# Patient Record
Sex: Female | Born: 1940 | Race: Black or African American | Hispanic: No | Smoking: Never smoker
Health system: Southern US, Community
[De-identification: ages and names within clinical notes are randomized; demographics above are authoritative.]

## PROBLEM LIST (undated history)

## (undated) DIAGNOSIS — I480 Paroxysmal atrial fibrillation: Secondary | ICD-10-CM

## (undated) DIAGNOSIS — I5030 Unspecified diastolic (congestive) heart failure: Secondary | ICD-10-CM

## (undated) DIAGNOSIS — C50919 Malignant neoplasm of unspecified site of unspecified female breast: Secondary | ICD-10-CM

## (undated) DIAGNOSIS — I2510 Atherosclerotic heart disease of native coronary artery without angina pectoris: Secondary | ICD-10-CM

## (undated) DIAGNOSIS — Z950 Presence of cardiac pacemaker: Secondary | ICD-10-CM

## (undated) DIAGNOSIS — I10 Essential (primary) hypertension: Secondary | ICD-10-CM

## (undated) HISTORY — PX: PACEMAKER IMPLANT: EP1218

---

## 2011-05-04 ENCOUNTER — Ambulatory Visit: Payer: Self-pay

## 2015-09-04 DIAGNOSIS — G4733 Obstructive sleep apnea (adult) (pediatric): Secondary | ICD-10-CM | POA: Insufficient documentation

## 2020-07-06 ENCOUNTER — Observation Stay
Admission: EM | Admit: 2020-07-06 | Discharge: 2020-07-07 | Disposition: A | Discharge status: Home / Self Care | Payer: Medicare Other | Attending: Internal Medicine | Admitting: Internal Medicine

## 2020-07-06 ENCOUNTER — Encounter: Payer: Self-pay | Admitting: Internal Medicine

## 2020-07-06 ENCOUNTER — Emergency Department: Payer: Medicare Other

## 2020-07-06 DIAGNOSIS — R739 Hyperglycemia, unspecified: Secondary | ICD-10-CM

## 2020-07-06 DIAGNOSIS — I509 Heart failure, unspecified: Secondary | ICD-10-CM | POA: Diagnosis not present

## 2020-07-06 DIAGNOSIS — Z20822 Contact with and (suspected) exposure to covid-19: Secondary | ICD-10-CM | POA: Insufficient documentation

## 2020-07-06 DIAGNOSIS — I161 Hypertensive emergency: Secondary | ICD-10-CM | POA: Diagnosis not present

## 2020-07-06 DIAGNOSIS — I110 Hypertensive heart disease with heart failure: Secondary | ICD-10-CM | POA: Insufficient documentation

## 2020-07-06 DIAGNOSIS — Z853 Personal history of malignant neoplasm of breast: Secondary | ICD-10-CM | POA: Diagnosis not present

## 2020-07-06 DIAGNOSIS — E1165 Type 2 diabetes mellitus with hyperglycemia: Secondary | ICD-10-CM | POA: Diagnosis not present

## 2020-07-06 DIAGNOSIS — Z8673 Personal history of transient ischemic attack (TIA), and cerebral infarction without residual deficits: Secondary | ICD-10-CM | POA: Insufficient documentation

## 2020-07-06 DIAGNOSIS — R569 Unspecified convulsions: Principal | ICD-10-CM

## 2020-07-06 DIAGNOSIS — I480 Paroxysmal atrial fibrillation: Secondary | ICD-10-CM

## 2020-07-06 DIAGNOSIS — J45909 Unspecified asthma, uncomplicated: Secondary | ICD-10-CM | POA: Diagnosis not present

## 2020-07-06 DIAGNOSIS — I2510 Atherosclerotic heart disease of native coronary artery without angina pectoris: Secondary | ICD-10-CM

## 2020-07-06 DIAGNOSIS — Z950 Presence of cardiac pacemaker: Secondary | ICD-10-CM

## 2020-07-06 DIAGNOSIS — Z794 Long term (current) use of insulin: Secondary | ICD-10-CM

## 2020-07-06 DIAGNOSIS — R5381 Other malaise: Secondary | ICD-10-CM

## 2020-07-06 HISTORY — DX: Atherosclerotic heart disease of native coronary artery without angina pectoris: I25.10

## 2020-07-06 HISTORY — DX: Essential (primary) hypertension: I10

## 2020-07-06 HISTORY — DX: Malignant neoplasm of unspecified site of unspecified female breast: C50.919

## 2020-07-06 HISTORY — DX: Unspecified diastolic (congestive) heart failure: I50.30

## 2020-07-06 HISTORY — DX: Paroxysmal atrial fibrillation: I48.0

## 2020-07-06 HISTORY — DX: Presence of cardiac pacemaker: Z95.0

## 2020-07-06 LAB — CBC WITH DIFFERENTIAL/PLATELET
Abs Immature Granulocytes: 0.04 10*3/uL (ref 0.00–0.07)
Basophils Absolute: 0.1 10*3/uL (ref 0.0–0.1)
Basophils Relative: 1 %
Eosinophils Absolute: 0.3 10*3/uL (ref 0.0–0.5)
Eosinophils Relative: 5 %
HCT: 45.8 % (ref 36.0–46.0)
Hemoglobin: 15.6 g/dL — ABNORMAL HIGH (ref 12.0–15.0)
Immature Granulocytes: 1 %
Lymphocytes Relative: 30 %
Lymphs Abs: 1.7 10*3/uL (ref 0.7–4.0)
MCH: 29.2 pg (ref 26.0–34.0)
MCHC: 34.1 g/dL (ref 30.0–36.0)
MCV: 85.8 fL (ref 80.0–100.0)
Monocytes Absolute: 0.5 10*3/uL (ref 0.1–1.0)
Monocytes Relative: 9 %
Neutro Abs: 3.1 10*3/uL (ref 1.7–7.7)
Neutrophils Relative %: 54 %
Platelets: 227 10*3/uL (ref 150–400)
RBC: 5.34 MIL/uL — ABNORMAL HIGH (ref 3.87–5.11)
RDW: 13.1 % (ref 11.5–15.5)
WBC: 5.7 10*3/uL (ref 4.0–10.5)
nRBC: 0 % (ref 0.0–0.2)

## 2020-07-06 LAB — LACTIC ACID, PLASMA
Lactic Acid, Venous: 2.3 mmol/L (ref 0.5–1.9)
Lactic Acid, Venous: 1.3 mmol/L (ref 0.5–1.9)

## 2020-07-06 LAB — GLUCOSE, CAPILLARY
Glucose-Capillary: 194 mg/dL — ABNORMAL HIGH (ref 70–99)
Glucose-Capillary: 288 mg/dL — ABNORMAL HIGH (ref 70–99)
Glucose-Capillary: 293 mg/dL — ABNORMAL HIGH (ref 70–99)
Glucose-Capillary: 320 mg/dL — ABNORMAL HIGH (ref 70–99)
Glucose-Capillary: 433 mg/dL — ABNORMAL HIGH (ref 70–99)

## 2020-07-06 LAB — COMPREHENSIVE METABOLIC PANEL
ALT: 14 U/L (ref 0–44)
AST: 17 U/L (ref 15–41)
Albumin: 4.3 g/dL (ref 3.5–5.0)
Alkaline Phosphatase: 64 U/L (ref 38–126)
Anion gap: 9 (ref 5–15)
BUN: 14 mg/dL (ref 8–23)
CO2: 29 mmol/L (ref 22–32)
Calcium: 8.9 mg/dL (ref 8.9–10.3)
Chloride: 104 mmol/L (ref 98–111)
Creatinine, Ser: 1.01 mg/dL — ABNORMAL HIGH (ref 0.44–1.00)
GFR calc Af Amer: >60 mL/min (ref >60)
GFR calc non Af Amer: 53 mL/min — ABNORMAL LOW (ref >60)
Glucose, Bld: 458 mg/dL — ABNORMAL HIGH (ref 70–99)
Potassium: 4 mmol/L (ref 3.5–5.1)
Sodium: 142 mmol/L (ref 135–145)
Total Bilirubin: 0.5 mg/dL (ref 0.3–1.2)
Total Protein: 8 g/dL (ref 6.5–8.1)

## 2020-07-06 LAB — BLOOD GAS, VENOUS
Acid-Base Excess: 4.4 mmol/L — ABNORMAL HIGH (ref 0.0–2.0)
Bicarbonate: 30.4 mmol/L — ABNORMAL HIGH (ref 20.0–28.0)
O2 Saturation: 90.2 %
Patient temperature: 37
pCO2, Ven: 49 mmHg (ref 44.0–60.0)
pH, Ven: 7.4 (ref 7.250–7.430)
pO2, Ven: 59 mmHg — ABNORMAL HIGH (ref 32.0–45.0)

## 2020-07-06 LAB — CBC
HCT: 45 % (ref 36.0–46.0)
Hemoglobin: 15.2 g/dL — ABNORMAL HIGH (ref 12.0–15.0)
MCH: 28.7 pg (ref 26.0–34.0)
MCHC: 33.8 g/dL (ref 30.0–36.0)
MCV: 84.9 fL (ref 80.0–100.0)
Platelets: 217 10*3/uL (ref 150–400)
RBC: 5.3 MIL/uL — ABNORMAL HIGH (ref 3.87–5.11)
RDW: 13.1 % (ref 11.5–15.5)
WBC: 7.9 10*3/uL (ref 4.0–10.5)
nRBC: 0 % (ref 0.0–0.2)

## 2020-07-06 LAB — TROPONIN I (HIGH SENSITIVITY)
Troponin I (High Sensitivity): 21 ng/L — ABNORMAL HIGH (ref <18)
Troponin I (High Sensitivity): 39 ng/L — ABNORMAL HIGH (ref <18)

## 2020-07-06 LAB — PROTIME-INR
INR: 0.9 (ref 0.8–1.2)
Prothrombin Time: 11.7 seconds (ref 11.4–15.2)

## 2020-07-06 LAB — CREATININE, SERUM
Creatinine, Ser: 0.83 mg/dL (ref 0.44–1.00)
GFR calc Af Amer: >60 mL/min (ref >60)
GFR calc non Af Amer: >60 mL/min (ref >60)

## 2020-07-06 LAB — APTT: aPTT: <24 seconds — ABNORMAL LOW (ref 24–36)

## 2020-07-06 LAB — SARS CORONAVIRUS 2 BY RT PCR (HOSPITAL ORDER, PERFORMED IN ~~LOC~~ HOSPITAL LAB): SARS Coronavirus 2: NEGATIVE

## 2020-07-06 MED ORDER — LISINOPRIL 10 MG PO TABS
20.0000 mg | ORAL_TABLET | Freq: Every day | ORAL | Status: DC
  Administered 2020-07-06 – 2020-07-07 (×2): 20 mg via ORAL
  Filled 2020-07-06 (×2): qty 2

## 2020-07-06 MED ORDER — NITROGLYCERIN 0.4 MG SL SUBL: 0.4000 mg | SUBLINGUAL_TABLET | SUBLINGUAL | Status: DC

## 2020-07-06 MED ORDER — ONDANSETRON HCL 4 MG/2ML IJ SOLN: 4.0000 mg | Freq: Four times a day (QID) | INTRAMUSCULAR | Status: DC | PRN

## 2020-07-06 MED ORDER — ALBUTEROL SULFATE (2.5 MG/3ML) 0.083% IN NEBU: 2.5000 mg | INHALATION_SOLUTION | RESPIRATORY_TRACT | Status: DC | PRN

## 2020-07-06 MED ORDER — SODIUM CHLORIDE 0.9 % IV SOLN
75.0000 mL/h | INTRAVENOUS | Status: DC
  Administered 2020-07-06: 75 mL/h via INTRAVENOUS

## 2020-07-06 MED ORDER — NICARDIPINE HCL IN NACL 20-0.86 MG/200ML-% IV SOLN
3.0000 mg/h | INTRAVENOUS | Status: DC
  Administered 2020-07-06: 5 mg/h via INTRAVENOUS
  Filled 2020-07-06 (×2): qty 200

## 2020-07-06 MED ORDER — ONDANSETRON HCL 4 MG PO TABS: 4.0000 mg | ORAL_TABLET | Freq: Four times a day (QID) | ORAL | Status: DC | PRN

## 2020-07-06 MED ORDER — LACTATED RINGERS IV BOLUS
1000.0000 mL | Freq: Once | INTRAVENOUS | Status: AC
  Administered 2020-07-06: 1000 mL via INTRAVENOUS

## 2020-07-06 MED ORDER — INSULIN ASPART 100 UNIT/ML ~~LOC~~ SOLN
0.0000 [IU] | Freq: Three times a day (TID) | SUBCUTANEOUS | Status: DC
  Administered 2020-07-06: 3 [IU] via SUBCUTANEOUS
  Administered 2020-07-06 – 2020-07-07 (×4): 8 [IU] via SUBCUTANEOUS
  Filled 2020-07-06 (×5): qty 1

## 2020-07-06 MED ORDER — ACETAMINOPHEN 650 MG RE SUPP: 650.0000 mg | RECTAL | Status: DC | PRN

## 2020-07-06 MED ORDER — ATORVASTATIN CALCIUM 20 MG PO TABS
80.0000 mg | ORAL_TABLET | Freq: Every day | ORAL | Status: DC
  Administered 2020-07-06 – 2020-07-07 (×2): 80 mg via ORAL
  Filled 2020-07-06 (×2): qty 4

## 2020-07-06 MED ORDER — AMLODIPINE BESYLATE 5 MG PO TABS
10.0000 mg | ORAL_TABLET | Freq: Every day | ORAL | Status: DC
  Administered 2020-07-06 – 2020-07-07 (×2): 10 mg via ORAL
  Filled 2020-07-06 (×2): qty 2

## 2020-07-06 MED ORDER — LABETALOL HCL 5 MG/ML IV SOLN
INTRAVENOUS | Status: AC
  Administered 2020-07-06: 10 mg
  Filled 2020-07-06: qty 4

## 2020-07-06 MED ORDER — INSULIN GLARGINE 100 UNIT/ML ~~LOC~~ SOLN
15.0000 [IU] | Freq: Every day | SUBCUTANEOUS | Status: DC
  Administered 2020-07-06: 15 [IU] via SUBCUTANEOUS
  Filled 2020-07-06 (×2): qty 0.15

## 2020-07-06 MED ORDER — ASPIRIN EC 81 MG PO TBEC
81.0000 mg | DELAYED_RELEASE_TABLET | Freq: Every day | ORAL | Status: DC
  Administered 2020-07-06 – 2020-07-07 (×2): 81 mg via ORAL
  Filled 2020-07-06 (×2): qty 1

## 2020-07-06 MED ORDER — INSULIN ASPART 100 UNIT/ML ~~LOC~~ SOLN
0.0000 [IU] | Freq: Every day | SUBCUTANEOUS | Status: DC
  Administered 2020-07-06: 4 [IU] via SUBCUTANEOUS
  Filled 2020-07-06: qty 1

## 2020-07-06 MED ORDER — ACETAMINOPHEN 325 MG PO TABS: 650.0000 mg | ORAL_TABLET | ORAL | Status: DC | PRN

## 2020-07-06 MED ORDER — ENOXAPARIN SODIUM 40 MG/0.4ML ~~LOC~~ SOLN
40.0000 mg | SUBCUTANEOUS | Status: DC
  Administered 2020-07-06: 40 mg via SUBCUTANEOUS
  Filled 2020-07-06: qty 0.4

## 2020-07-06 MED ORDER — LORAZEPAM 2 MG/ML IJ SOLN
INTRAMUSCULAR | Status: AC
  Filled 2020-07-06: qty 1

## 2020-07-06 MED ORDER — LORAZEPAM 2 MG/ML IJ SOLN: 1.0000 mg | INTRAMUSCULAR | Status: DC | PRN

## 2020-07-06 MED ORDER — CARVEDILOL 25 MG PO TABS
25.0000 mg | ORAL_TABLET | Freq: Two times a day (BID) | ORAL | Status: DC
  Administered 2020-07-06 – 2020-07-07 (×3): 25 mg via ORAL
  Filled 2020-07-06 (×3): qty 1

## 2020-07-06 MED ORDER — ALBUTEROL SULFATE HFA 108 (90 BASE) MCG/ACT IN AERS: 2.0000 | INHALATION_SPRAY | Freq: Four times a day (QID) | RESPIRATORY_TRACT | Status: DC | PRN

## 2020-07-06 NOTE — ED Notes (Signed)
EMS reports daughter recently brought patient home with her due to patient being noncompliant with medications while living alone.  Patient is able to state name when asked.  Patient with gaze to the left.

## 2020-07-06 NOTE — ED Notes (Signed)
Patient is awake and alert to person and place.  Patient recognizes her daughter.  Patient is able to follow commands a this time.

## 2020-07-06 NOTE — ED Notes (Signed)
Pt up to bathroom with assistance 

## 2020-07-06 NOTE — ED Triage Notes (Signed)
Patient to rm 9 via EMS from home after a seizure.  Per EMS patient's daughter was assisting her to bathroom when she had a seizure.  Patient with one previous seizure that was attributed to her diabetes and hypertension.  EMS interventions - saline loc to right hand via 20 g. ngt paste 2 inches to left upper arm, cbg read - high.  Blood pressure 258/p

## 2020-07-06 NOTE — ED Notes (Signed)
Family with pt now.

## 2020-07-06 NOTE — ED Notes (Signed)
fsbs 320

## 2020-07-06 NOTE — Consult Note (Addendum)
Requesting Physician: Damita Dunnings    Chief Complaint: Tara Sandoval  I have been asked by Dr. Damita Dunnings to see this patient in consultation for recurrent seizure.  HPI: Tara Sandoval is an 79 y.o. female with medical history significant for paroxysmal A. fib not on anticoagulation, diastolic heart failure, pacemaker placement for sick sinus syndrome, uncontrolled hypertension, CAD and history of breast cancer, as well as remote history of a prior seizure not on antiepileptics, who was brought in by EMS after a witnessed seizure at home while daughter was helping her to the bathroom.  Patient was postictal by arrival of EMS and EMS reported a systolic blood pressure of over 250.  She received IV labetalol in route and Nitropaste. Patient reports that she had a seizure in March of this year.  Was felt to be secondary to her other medical problems and she was not started on anticonvulsant therapy.  I do not have records of this for review.     Past Medical History:  Diagnosis Date  . Breast cancer ([place])   . CAD (coronary artery disease)   . Diastolic CHF ([place])   . Hypertension   . Pacemaker   . Paroxysmal atrial fibrillation [facility])     Past Surgical History:  Procedure Laterality Date  . PACEMAKER IMPLANT      Family history: Parents deceased.  Maternal aunt with cancer.    Social History:  reports that she has never smoked. She has never used smokeless tobacco. She reports previous alcohol use. She reports previous drug use.  Allergies  Allergen Reactions  . Penicillins Hives  . Simvastatin Hives and Other (See Comments)  . Iron Itching and Other (See Comments)    Pt. States doesn't bother Pt. States doesn't bother     Medications: I have reviewed the patient's current medications. Prior to Admission medications   Medication Sig Start Date End Date Taking? Authorizing Provider  acetaminophen (TYLENOL) 650 MG CR tablet Take 650 mg by mouth every 8 (eight) hours as needed for pain. 08/18/17  Yes  [provider]  albuterol (PROVENTIL) (2.5 MG/3ML) 0.083% nebulizer solution Inhale 2.5 mg into the lungs every 4 (four) hours as needed. 05/20/11  Yes [provider]  albuterol (VENTOLIN HFA) 108 (90 Base) MCG/ACT inhaler Inhale 2 puffs into the lungs every 6 (six) hours as needed for wheezing or shortness of breath.   Yes [provider]  amLODipine (NORVASC) 10 MG tablet Take 10 mg by mouth daily.  05/28/20  Yes [provider]  aspirin 81 MG EC tablet Take 81 mg by mouth daily.   Yes [provider]  atorvastatin (LIPITOR) 80 MG tablet Take 80 mg by mouth daily. 05/27/20  Yes [provider]  carvedilol (COREG) 25 MG tablet Take 25 mg by mouth 2 (two) times daily. 05/27/20  Yes [provider]  LANTUS 100 UNIT/ML injection Inject 15 Units into the skin at bedtime. 02/19/20  Yes [provider]  lisinopril (ZESTRIL) 20 MG tablet Take 20 mg by mouth daily. 05/28/20  Yes [provider]  nitroGLYCERIN (NITROSTAT) 0.4 MG SL tablet Place 0.4 mg under the tongue See admin instructions. Place 1 tablet (0.5 mg) under the tongue every five minutes as needed for chest pain. May take up to three doses.   Yes [provider]  RYBELSUS 7 MG TABS Take 1 tablet by mouth daily. 06/23/20  Yes [provider]  insulin detemir (LEVEMIR) 100 UNIT/ML injection Inject 12 Units into the skin  2 (two) times daily. Patient not taking: Reported on 07/06/2020 03/26/19   [provider]    ROS: History obtained from the patient  General ROS: negative for - chills, fatigue, fever, night sweats, weight gain or weight loss Psychological ROS: negative for - behavioral disorder, hallucinations, memory difficulties, mood swings or suicidal ideation Ophthalmic ROS: negative for - blurry vision, double vision, eye pain or loss of vision ENT ROS: negative for - epistaxis, nasal discharge, oral lesions, sore throat, tinnitus or  vertigo Allergy and Immunology ROS: negative for - hives or itchy/watery eyes Hematological and Lymphatic ROS: negative for - bleeding problems, bruising or swollen lymph nodes Endocrine ROS: negative for - galactorrhea, hair pattern changes, polydipsia/polyuria or temperature intolerance Respiratory ROS: negative for - cough, hemoptysis, shortness of breath or wheezing Cardiovascular ROS: negative for - chest pain, dyspnea on exertion, edema or irregular heartbeat Gastrointestinal ROS: negative for - abdominal pain, diarrhea, hematemesis, nausea/vomiting or stool incontinence Genito-Urinary ROS: negative for - dysuria, hematuria, incontinence or urinary frequency/urgency Musculoskeletal ROS: negative for - joint swelling or muscular weakness Neurological ROS: as noted in HPI Dermatological ROS: negative for rash and skin lesion changes  Physical Examination: Blood pressure 131/70, pulse 63, temperature 98.5 F (36.9 C), temperature source Oral, resp. rate 16, SpO2 95 %.  HEENT-  Normocephalic, no lesions, without obvious abnormality.  Normal external eye and conjunctiva.  Normal TM's bilaterally.  Normal auditory canals and external ears. Normal external nose, mucus membranes and septum.  Normal pharynx. Cardiovascular- S1, S2 normal, pulses palpable throughout   Lungs- chest clear, no wheezing, rales, normal symmetric air entry Abdomen- soft, non-tender; bowel sounds normal; no masses,  no organomegaly Extremities- LE edema Lymph-no adenopathy palpable Musculoskeletal-no joint tenderness, deformity or swelling Skin-warm and dry, no hyperpigmentation, vitiligo, or suspicious lesions  Neurological Examination   Mental Status: Alert, oriented, thought content appropriate.  Speech fluent without evidence of aphasia.  Able to follow 3 step commands without difficulty. Cranial Nerves: II: Visual fields grossly normal, pupils equal, round, reactive to light and accommodation III,IV, VI:  ptosis not present, extra-ocular motions intact bilaterally V,VII: smile symmetric, facial light touch sensation normal bilaterally VIII: hearing normal bilaterally IX,X: gag reflex present XI: bilateral shoulder shrug XII: midline tongue extension Motor: Able to lift all extremities against gravity with no focal weakness appreciated.   Sensory: Pinprick and light touch intact throughout, bilaterally Deep Tendon Reflexes: Symmetric throughout Plantars: Right: mute   Left: mute Cerebellar: Normal finger-to-nose and normal heel-to-shin testing bilaterally Gait: not tested due to safety concerns    Laboratory Studies:   Basic Metabolic Panel: Recent Labs  Lab 07/06/20 0053 07/06/20 0251  NA 142  --   K 4.0  --   CL 104  --   CO2 29  --   GLUCOSE 458*  --   BUN 14  --   CREATININE 1.01* 0.83  CALCIUM 8.9  --     Liver Function Tests: Recent Labs  Lab 07/06/20 0053  AST 17  ALT 14  ALKPHOS 64  BILITOT 0.5  PROT 8.0  ALBUMIN 4.3   No results for input(s): LIPASE, AMYLASE in the last 168 hours. No results for input(s): AMMONIA in the last 168 hours.  CBC: Recent Labs  Lab 07/06/20 0053 07/06/20 0251  WBC 5.7 7.9  NEUTROABS 3.1  --   HGB 15.6* 15.2*  HCT 45.8 45.0  MCV 85.8 84.9  PLT 227 217    Cardiac Enzymes: No results for input(s): CKTOTAL,  CKMB, CKMBINDEX, TROPONINI in the last 168 hours.  BNP: Invalid input(s): POCBNP  CBG: Recent Labs  Lab 07/06/20 0025 07/06/20 0922  GLUCAP 433* 293*    Microbiology: Results for orders placed or performed during the hospital encounter of 07/06/20  SARS Coronavirus 2 by RT PCR (hospital order, performed in [facility] hospital lab) Nasopharyngeal Nasopharyngeal Swab     Status: None   Collection Time: 07/06/20  1:39 AM   Specimen: Nasopharyngeal Swab  Result Value Ref Range Status   SARS Coronavirus 2 NEGATIVE NEGATIVE Final    Comment: (NOTE) SARS-CoV-2 target nucleic acids are NOT DETECTED.  The  SARS-CoV-2 RNA is generally detectable in upper and lower respiratory specimens during the acute phase of infection. The lowest concentration of SARS-CoV-2 viral copies this assay can detect is 250 copies / mL. A negative result does not preclude SARS-CoV-2 infection and should not be used as the sole basis for treatment or other patient management decisions.  A negative result may occur with improper specimen collection / handling, submission of specimen other than nasopharyngeal swab, presence of viral mutation(s) within the areas targeted by this assay, and inadequate number of viral copies (<250 copies / mL). A negative result must be combined with clinical observations, patient history, and epidemiological information.  Fact Sheet for Patients:   StrictlyIdeas.no  Fact Sheet for Healthcare Providers: BankingDealers.co.za  This test is not yet approved or  cleared by the [place] FDA and has been authorized for detection and/or diagnosis of SARS-CoV-2 by FDA under an Emergency Use Authorization (EUA).  This EUA will remain in effect (meaning this test can be used) for the duration of the COVID-19 declaration under Section 564(b)(1) of the Act, 21 U.S.C. section 360bbb-3(b)(1), unless the authorization is terminated or revoked sooner.  Performed at [facility], [address] [place], [place] [address]     Coagulation Studies: Recent Labs    07/06/20 0053  LABPROT 11.7  INR 0.9    Urinalysis: No results for input(s): COLORURINE, LABSPEC, PHURINE, GLUCOSEU, HGBUR, BILIRUBINUR, KETONESUR, PROTEINUR, UROBILINOGEN, NITRITE, LEUKOCYTESUR in the last 168 hours.  Invalid input(s): APPERANCEUR  Lipid Panel:  No results found for: CHOL, TRIG, HDL, CHOLHDL, VLDL, LDLCALC  HgbA1C: No results found for: HGBA1C  Urine Drug Screen:  No results found for: LABOPIA, COCAINSCRNUR, LABBENZ, AMPHETMU, THCU, LABBARB   Alcohol Level: No results for input(s): ETH in the last 168 hours.   Imaging: CT HEAD CODE STROKE WO CONTRAST`  Addendum Date: 07/06/2020   ADDENDUM REPORT: 07/06/2020 01:06 ADDENDUM: Study discussed by telephone with Dr. Rudene Re on 07/06/2020 at 0055 hours. Electronically Signed   By: Genevie Tionne M.D.   On: 07/06/2020 01:06   Result Date: 07/06/2020 CLINICAL DATA:  Code stroke. 79 year old female status post seizure like activity. EXAM: CT HEAD WITHOUT CONTRAST TECHNIQUE: Contiguous axial images were obtained from the base of the skull through the vertex without intravenous contrast. COMPARISON:  [facility] head CT 05/30/2020, and earlier. FINDINGS: Brain: No midline shift, mass effect, or evidence of intracranial mass lesion. No ventriculomegaly. No acute intracranial hemorrhage identified. Confluent bilateral cerebral white matter hypodensity with hypodense heterogeneity in the deep white matter capsules and deep gray matter nuclei (especially the right thalamus). Stable gray-white matter differentiation throughout the brain. No midline shift, ventriculomegaly, mass effect, evidence of mass lesion, intracranial hemorrhage or evidence of cortically based acute infarction. No cortical encephalomalacia identified. Vascular: Calcified atherosclerosis at the skull base. No suspicious intracranial vascular hyperdensity. Skull:  Stable since 2017, including pitting of the middle cranial fossa. No acute osseous abnormality identified. Sinuses/Orbits: Visualized paranasal sinuses and mastoids are stable and well pneumatized. Other: No acute orbit or scalp soft tissue findings. ASPECTS [facility] Stroke Program Early CT Score) Total score (0-10 with 10 being normal): 10 IMPRESSION: Stable non contrast CT appearance of advanced small vessel disease since last month. No acute cortically based infarct or intracranial hemorrhage identified. ASPECTS 10. Electronically Signed: By: Genevie Keyetta M.D. On:  07/06/2020 00:52     Assessment/Plan:  79 y.o. female with medical history significant for paroxysmal A. fib not on anticoagulation, diastolic heart failure, pacemaker placement for sick sinus syndrome, uncontrolled hypertension, CAD and history of breast cancer, as well as remote history of a prior seizure not on antiepileptics, who was brought in by EMS after a witnessed seizure at home while daughter was helping her to the bathroom.  Patient was postictal by arrival of EMS and EMS reported a systolic blood pressure of over 250.  She received IV labetalol in route and Nitropaste. Patient reports that she had a seizure in March of this year.  Was felt to be secondary to her other medical problems and she was not started on anticonvulsant therapy.  I do not have records of this for review.   Head CT personally reviewed and shows no acute changes.  BP markedly elevated on presentation with a high of 241/117.  Blood sugar over 400.  Again seizure may have been provoked due to multiple poorly controlled risk factors.  With afib, can not rule out an embolic event but patient has no focality at this time.  Unable to have MRI of the brain due to pacemaker.    Recommendations: 1. Agree with BP control 2. Agree with blood sugar control 3. EEG 4. Would not initiate anticonvulsant therapy at this time.   5. Seizure precautions 6. Orthostatic vitals  Alexis Goodell, MD Neurology [phone] 07/06/2020, 1:16 PM

## 2020-07-06 NOTE — ED Notes (Signed)
Pt is now eating lunch, will check CBG once she is done

## 2020-07-06 NOTE — ED Notes (Signed)
Patient is resting quietly at this time, voices no complaints.

## 2020-07-06 NOTE — ED Notes (Signed)
Resumed care from [place] rn.  Pt sleeping  Easily aroused.  Sinus on monitor.  Iv in place.

## 2020-07-06 NOTE — Progress Notes (Signed)
She is feeling better.  She does not remember what happened to her.  She is awake, alert and oriented x3.  No focal deficits noted.  Resume home antihypertensives and taper off IV Cardene drip infusion.  Follow-up with neurologist for further recommendations.    LOS: 0 days   Tara Sandoval  [facility]     07/06/2020, 9:05 AM

## 2020-07-06 NOTE — ED Provider Notes (Signed)
[facility] Emergency Department Provider Note  ____________________________________________  Time seen: Approximately 1:14 AM  I have reviewed the triage vital signs and the nursing notes.   HISTORY  Chief Complaint Seizures   HPI Tara Sandoval is a 79 y.o. female history of diabetes, CVA, breast cancer, hypertension, asthma, sick sinus syndrome status post pacemaker, CHF who presents for evaluation of a seizure.  Patient had a 2 to 3-minute generalized tonic-clonic seizure.  Family is not here with patient.  Patient arrives postictal.  According to EMS, family told them the patient had 1 seizure in the past but is not on any antiseizure medications.  Patient has a history of noncompliance with her medications.  Upon arrival to the home, EMS found patient postictal state but no longer seizing.  Patient was incontinent of urine.  Blood glucose was too high to read on their monitor and her blood pressure was in the 638L systolic.  An inch of Nitropaste was applied to the left upper extremity.  Per EMS, patient seems to be at baseline just prior to the seizure.   PMH Breast cancer (CMS-HCC) 03/2009 left breast  Hypertension    Diabetes mellitus type 2, uncomplicated (CMS-HCC)    Arthritis    Stroke (CMS-HCC)  2005  GERD (gastroesophageal reflux disease)    Asthma without status asthmaticus, unspecified    Heart failure (CMS-HCC)    Encounter for blood transfusion    Pacemaker       Allergies Penicillins, Simvastatin, and Iron  FH Cancer Maternal Aunt  unsure of type    Social History Smoking - former Alcohol - no Drugs - no  Review of Systems  Constitutional: Negative for fever. Eyes: Negative for visual changes. ENT: Negative for sore throat. Neck: No neck pain  Cardiovascular: Negative for chest pain. Respiratory: Negative for shortness of breath. Gastrointestinal: Negative for abdominal pain, vomiting or  diarrhea. Genitourinary: Negative for dysuria. Musculoskeletal: Negative for back pain. Skin: Negative for rash. Neurological: Negative for headaches, weakness or numbness. + seizure Psych: No SI or HI  ____________________________________________   PHYSICAL EXAM:  VITAL SIGNS: ED Triage Vitals [07/06/20 0023]  Enc Vitals Group     BP (!) 231/81     Pulse Rate 88     Resp (!) 21     Temp 98.5 F (36.9 C)     Temp Source Oral     SpO2 95 %     Weight      Height      Head Circumference      Peak Flow      Pain Score      Pain Loc      Pain Edu?      Excl. in [place]?     Constitutional: Post-ictal, confused, not following commands or answering questions, awake, moving all extremities HEENT:      Head: Normocephalic and atraumatic.         Eyes: Conjunctivae are normal. Sclera is non-icteric.       Mouth/Throat: Mucous membranes are moist.       Neck: Supple with no signs of meningismus. Cardiovascular: Regular rate and rhythm. No murmurs, gallops, or rubs. 2+ symmetrical distal pulses are present in all extremities.  Respiratory: Normal respiratory effort. Lungs are clear to auscultation bilaterally.  Gastrointestinal: Soft, non tender, and non distended  Musculoskeletal:  No edema, cyanosis, or erythema of extremities. Neurologic: Awake, confused, not answering questions, not speaking, face symmetric, moving all extremities Skin: Skin  is warm, dry and intact. No rash noted.  ____________________________________________   LABS (all labs ordered are listed, but only abnormal results are displayed)  Labs Reviewed  GLUCOSE, CAPILLARY - Abnormal; Notable for the following components:      Result Value   Glucose-Capillary 433 (*)    All other components within normal limits  CBC WITH DIFFERENTIAL/PLATELET - Abnormal; Notable for the following components:   RBC 5.34 (*)    Hemoglobin 15.6 (*)    All other components within normal limits  COMPREHENSIVE METABOLIC PANEL -  Abnormal; Notable for the following components:   Glucose, Bld 458 (*)    Creatinine, Ser 1.01 (*)    GFR calc non Af Amer 53 (*)    All other components within normal limits  BLOOD GAS, VENOUS - Abnormal; Notable for the following components:   pO2, Ven 59.0 (*)    Bicarbonate 30.4 (*)    Acid-Base Excess 4.4 (*)    All other components within normal limits  LACTIC ACID, PLASMA - Abnormal; Notable for the following components:   Lactic Acid, Venous 2.3 (*)    All other components within normal limits  APTT - Abnormal; Notable for the following components:   aPTT <24 (*)    All other components within normal limits  TROPONIN I (HIGH SENSITIVITY) - Abnormal; Notable for the following components:   Troponin I (High Sensitivity) 21 (*)    All other components within normal limits  SARS CORONAVIRUS 2 BY RT PCR (HOSPITAL ORDER, [place] [facility])  PROTIME-INR  URINALYSIS, COMPLETE (UACMP) WITH MICROSCOPIC  LACTIC ACID, PLASMA   ____________________________________________  EKG  ED ECG REPORT I, Rudene Re, the attending physician, personally viewed and interpreted this ECG.  Normal sinus rhythm with left bundle branch block, borderline prolonged QTC, T wave inversions in inferior lateral leads with ST depressions in lateral leads and no ST elevation.  No prior for comparison. ____________________________________________  RADIOLOGY  I have personally reviewed the images performed during this visit and I agree with the Radiologist's read.   Interpretation by Radiologist:  CT HEAD CODE STROKE WO CONTRAST`  Addendum Date: 07/06/2020   ADDENDUM REPORT: 07/06/2020 01:06 ADDENDUM: Study discussed by telephone with Dr. Rudene Re on 07/06/2020 at 0055 hours. Electronically Signed   By: Genevie Varnika M.D.   On: 07/06/2020 01:06   Result Date: 07/06/2020 CLINICAL DATA:  Code stroke. 79 year old female status post seizure like activity. EXAM: CT HEAD WITHOUT  CONTRAST TECHNIQUE: Contiguous axial images were obtained from the base of the skull through the vertex without intravenous contrast. COMPARISON:  [facility] head CT 05/30/2020, and earlier. FINDINGS: Brain: No midline shift, mass effect, or evidence of intracranial mass lesion. No ventriculomegaly. No acute intracranial hemorrhage identified. Confluent bilateral cerebral white matter hypodensity with hypodense heterogeneity in the deep white matter capsules and deep gray matter nuclei (especially the right thalamus). Stable gray-white matter differentiation throughout the brain. No midline shift, ventriculomegaly, mass effect, evidence of mass lesion, intracranial hemorrhage or evidence of cortically based acute infarction. No cortical encephalomalacia identified. Vascular: Calcified atherosclerosis at the skull base. No suspicious intracranial vascular hyperdensity. Skull: Stable since 2017, including pitting of the middle cranial fossa. No acute osseous abnormality identified. Sinuses/Orbits: Visualized paranasal sinuses and mastoids are stable and well pneumatized. Other: No acute orbit or scalp soft tissue findings. ASPECTS [facility] Stroke Program Early CT Score) Total score (0-10 with 10 being normal): 10 IMPRESSION: Stable non contrast CT appearance of advanced  small vessel disease since last month. No acute cortically based infarct or intracranial hemorrhage identified. ASPECTS 10. Electronically Signed: By: Genevie Skylyn M.D. On: 07/06/2020 00:52     ____________________________________________   PROCEDURES  Procedure(s) performed:yes .1-3 Lead EKG Interpretation Performed by: Rudene Re, MD Authorized by: Rudene Re, MD     Interpretation: non-specific     ECG rate assessment: normal     Rhythm: sinus rhythm     Conduction normal: LBBB.     Critical Care performed:  Yes  CRITICAL CARE Performed by: Rudene Re  ?  Total critical care time: 40  min  Critical care time was exclusive of separately billable procedures and treating other patients.  Critical care was necessary to treat or prevent imminent or life-threatening deterioration.  Critical care was time spent personally by me on the following activities: development of treatment plan with patient and/or surrogate as well as nursing, discussions with consultants, evaluation of patient's response to treatment, examination of patient, obtaining history from patient or surrogate, ordering and performing treatments and interventions, ordering and review of laboratory studies, ordering and review of radiographic studies, pulse oximetry and re-evaluation of patient's condition.  ____________________________________________   INITIAL IMPRESSION / ASSESSMENT AND PLAN / ED COURSE   79 y.o. female history of diabetes, CVA, breast cancer, hypertension, asthma, sick sinus syndrome status post pacemaker, CHF who presents for evaluation of a seizure.  Seems patient has had 1 prior episode of seizure in the past but no history of epilepsy and she is not on any antiepileptic medications.  She arrived very confused, not following commands or answering to questions however over the course of the first 15 to 20 minutes her mental status cleared.  Patient no recollection of the event.  She denies any pain including headache, chest pain or shortness of breath.  She was incontinent of urine and her presentation is concerning for a seizure.  Initial blood pressure in the 300T systolic.  Patient was given a dose of IV labetalol with no changes in the blood pressure and was started on IV nicardipine drip.  She was taken to the CT scanner immediately for concerns of head bleed but the CT is unremarkable, confirmed by radiology.  We will interrogate the pacemaker for any signs of dysrhythmias although at this point I believe her presentation was due to seizure.  Her blood glucose was 431 on arrival.  Will check for  signs of DKA or infection.  Patient has had a stroke in the past and therefore could be more prone to seizures in the setting of metabolic derangements, acidosis, or infection.  Family is not at bedside at this time.  Labs are pending.  Old medical records have been reviewed.     _________________________ 2:08 AM on 07/06/2020 -----------------------------------------  Patient is now alert and oriented.  On nicardipine drip at 7.5mg  with BP in the 160s.  Labs showing mildly elevated lactic acidosis most likely due to seizing.  There is no signs of infection with no fever, no leukocytosis.  The urine is pending.  No signs of DKA.  Patient be admitted to the hospitalist service for hypertensive emergency and hyperglycemia.  _____________________________________________ Please note:  Patient was evaluated in Emergency Department today for the symptoms described in the history of present illness. Patient was evaluated in the context of the global COVID-19 pandemic, which necessitated consideration that the patient might be at risk for infection with the SARS-CoV-2 virus that causes COVID-19. Institutional protocols and algorithms that  pertain to the evaluation of patients at risk for COVID-19 are in a state of rapid change based on information released by regulatory bodies including the CDC and federal and state organizations. These policies and algorithms were followed during the patient's care in the ED.  Some ED evaluations and interventions may be delayed as a result of limited staffing during the pandemic.   [place] Controlled Substance Database was reviewed by me. ____________________________________________   FINAL CLINICAL IMPRESSION(S) / ED DIAGNOSES   Final diagnoses:  Hypertensive emergency  Hyperglycemia  Seizure ([place])      NEW MEDICATIONS STARTED DURING THIS VISIT:  ED Discharge Orders    None       Note:  This document was prepared using Dragon voice recognition software and  may include unintentional dictation errors.    Alfred Levins, [place], MD 07/06/20 [phone]

## 2020-07-06 NOTE — Progress Notes (Signed)
Inpatient Diabetes Program Recommendations  AACE/ADA: New Consensus Statement on Inpatient Glycemic Control (2015)  Target Ranges:  Prepandial:   less than 140 mg/dL      Peak postprandial:   less than 180 mg/dL (1-2 hours)      Critically ill patients:  140 - 180 mg/dL   Results for Tara Sandoval, Tara Sandoval (MRN [id number]) as of 07/06/2020 07:41  Ref. Range 07/06/2020 00:25  Glucose-Capillary Latest Ref Range: 70 - 99 mg/dL 433 (H)    Admit with: Seizures/ Hypertensive emergency/encephalopathy  History: DM  Home DM Meds: Lantus 15 units QHS       Rybelsus 7 mg Daily  Current Orders: Novolog Moderate Correction Scale/ SSI (0-15 units) TID AC + HS      Lantus 15 units QHS     Note Lantus to start tonight at bedtime.    Novolog to start this AM.  Current A1c pending.    --Will follow patient during hospitalization--  Wyn Quaker RN, MSN, CDE Diabetes Coordinator Inpatient Glycemic Control Team Team Pager: [phone] (8a-5p)

## 2020-07-06 NOTE — H&P (Addendum)
History and Physical    Tara Sandoval:[id number] DOB: 07/25/1941 DOA: 07/06/2020  PCP: System, Pcp Not In   Patient coming from: Home  I have personally briefly reviewed patient's old medical records in [place]  Chief Complaint: Seizure  HPI: Tara Sandoval is a 79 y.o. female with medical history significant for paroxysmal A. fib not on anticoagulation, diastolic heart failure, pacemaker placement for sick sinus syndrome, uncontrolled hypertension, CAD and history of breast cancer, as well as remote history of a prior seizure not on antiepileptics, who was brought in by EMS after a witnessed seizure at home while daughter was helping her to the bathroom.  Patient was postictal by arrival of EMS and EMS reported a systolic blood pressure of over 250.  She received IV labetalol in route and Nitropaste. ED Course: By arrival in the emergency room, BP 231/81, RR 21 with otherwise normal vitals.  CT head showed advanced small vessel disease with no acute infarct or intracranial hemorrhage identified.  Blood work significant for glucose of 458, creatinine 1.01, lactic acid 2.3.  Troponin 21.  EKG showed normal sinus rhythm with LBBB.  Patient was placed on a nicardipine infusion.  Hospitalist consulted for admission.  Review of Systems: Limited as patient still postictal and confused  Past Medical History:  Diagnosis Date   Breast cancer ([place])    CAD (coronary artery disease)    Diastolic CHF ([place])    Hypertension    Pacemaker    Paroxysmal atrial fibrillation ([place])     Past Surgical History:  Procedure Laterality Date   PACEMAKER IMPLANT       reports that she has never smoked. She has never used smokeless tobacco. She reports previous alcohol use. She reports previous drug use.  Allergies  Allergen Reactions   Penicillins Hives   Simvastatin Hives and Other (See Comments)   Iron Itching and Other (See Comments)    Pt. States doesn't bother Pt. States doesn't  bother     History reviewed. No pertinent family history.    Prior to Admission medications   Medication Sig Start Date End Date Taking? Authorizing Provider  acetaminophen (TYLENOL) 650 MG CR tablet Take 650 mg by mouth every 8 (eight) hours as needed for pain. 08/18/17  Yes [provider]  albuterol (PROVENTIL) (2.5 MG/3ML) 0.083% nebulizer solution Inhale 2.5 mg into the lungs every 4 (four) hours as needed. 05/20/11  Yes [provider]  amLODipine (NORVASC) 5 MG tablet Take 5 mg by mouth daily. 05/28/20   [provider]  atorvastatin (LIPITOR) 80 MG tablet Take 80 mg by mouth daily. 05/27/20   [provider]  carvedilol (COREG) 25 MG tablet Take 25 mg by mouth 2 (two) times daily. 05/27/20   [provider]  insulin detemir (LEVEMIR) 100 UNIT/ML injection Inject 12 Units into the skin 2 (two) times daily. 03/26/19   [provider]  lisinopril (ZESTRIL) 20 MG tablet Take 20 mg by mouth daily. 05/28/20   [provider]  nitroGLYCERIN (NITROSTAT) 0.4 MG SL tablet Place 0.4 mg under the tongue See admin instructions. Place 1 tablet (0.5 mg) under the tongue every five minutes as needed for chest pain. May take up to three doses.    [provider]  RYBELSUS 7 MG TABS Take 1 tablet by mouth daily. 06/23/20   [provider]    Physical Exam: Vitals:   07/06/20 0109 07/06/20 0114 07/06/20 0124 07/06/20 0129  BP: Marland Kitchen)  199/87 (!) 197/183 (!) 172/98 (!) 160/76  Pulse: 68 75 85 78  Resp: 17 18 17 17   Temp:      TempSrc:      SpO2: 94% 94% 91% 97%     Vitals:   07/06/20 0109 07/06/20 0114 07/06/20 0124 07/06/20 0129  BP: (!) 199/87 (!) 197/183 (!) 172/98 (!) 160/76  Pulse: 68 75 85 78  Resp: 17 18 17 17   Temp:      TempSrc:      SpO2: 94% 94% 91% 97%      Constitutional:  Somnolent but will awake with shaking, confused.  Oriented to name and place. HEENT:      Head: Normocephalic and atraumatic.          Eyes: PERLA, EOMI, Conjunctivae are normal. Sclera is non-icteric.       Mouth/Throat: Mucous membranes are moist.       Neck: Supple with no signs of meningismus. Cardiovascular: Regular rate and rhythm. No murmurs, gallops, or rubs. 2+ symmetrical distal pulses are present . No JVD. No LE edema Respiratory: Respiratory effort normal .Lungs sounds clear bilaterally. No wheezes, crackles, or rhonchi.  Gastrointestinal: Soft, non tender, and non distended with positive bowel sounds. No rebound or guarding. Genitourinary: No CVA tenderness. Musculoskeletal: Nontender with normal range of motion in all extremities. No cyanosis, or erythema of extremities. Neurologic: Normal speech and language. Face is symmetric. Moving all extremities. No gross focal neurologic deficits . Skin: Skin is warm, dry.  No rash or ulcers Psychiatric: Unable to assess due to somnolence  Labs on Admission: I have personally reviewed following labs and imaging studies  CBC: Recent Labs  Lab 07/06/20 0053  WBC 5.7  NEUTROABS 3.1  HGB 15.6*  HCT 45.8  MCV 85.8  PLT 299   Basic Metabolic Panel: Recent Labs  Lab 07/06/20 0053  NA 142  K 4.0  CL 104  CO2 29  GLUCOSE 458*  BUN 14  CREATININE 1.01*  CALCIUM 8.9   GFR: CrCl cannot be calculated (Unknown ideal weight.). Liver Function Tests: Recent Labs  Lab 07/06/20 0053  AST 17  ALT 14  ALKPHOS 64  BILITOT 0.5  PROT 8.0  ALBUMIN 4.3   No results for input(s): LIPASE, AMYLASE in the last 168 hours. No results for input(s): AMMONIA in the last 168 hours. Coagulation Profile: Recent Labs  Lab 07/06/20 0053  INR 0.9   Cardiac Enzymes: No results for input(s): CKTOTAL, CKMB, CKMBINDEX, TROPONINI in the last 168 hours. BNP (last 3 results) No results for input(s): PROBNP in the last 8760 hours. HbA1C: No results for input(s): HGBA1C in the last 72 hours. CBG: Recent Labs  Lab 07/06/20 0025  GLUCAP 433*   Lipid Profile: No results for  input(s): CHOL, HDL, LDLCALC, TRIG, CHOLHDL, LDLDIRECT in the last 72 hours. Thyroid Function Tests: No results for input(s): TSH, T4TOTAL, FREET4, T3FREE, THYROIDAB in the last 72 hours. Anemia Panel: No results for input(s): VITAMINB12, FOLATE, FERRITIN, TIBC, IRON, RETICCTPCT in the last 72 hours. Urine analysis: No results found for: COLORURINE, APPEARANCEUR, LABSPEC, PHURINE, GLUCOSEU, HGBUR, BILIRUBINUR, KETONESUR, PROTEINUR, UROBILINOGEN, NITRITE, LEUKOCYTESUR  Radiological Exams on Admission: CT HEAD CODE STROKE WO CONTRAST`  Addendum Date: 07/06/2020   ADDENDUM REPORT: 07/06/2020 01:06 ADDENDUM: Study discussed by telephone with Dr. Rudene Re on 07/06/2020 at 0055 hours. Electronically Signed   By: Genevie Derenda M.D.   On: 07/06/2020 01:06   Result Date: 07/06/2020 CLINICAL DATA:  Code stroke. 79 year old female status  post seizure like activity. EXAM: CT HEAD WITHOUT CONTRAST TECHNIQUE: Contiguous axial images were obtained from the base of the skull through the vertex without intravenous contrast. COMPARISON:  [facility] head CT 05/30/2020, and earlier. FINDINGS: Brain: No midline shift, mass effect, or evidence of intracranial mass lesion. No ventriculomegaly. No acute intracranial hemorrhage identified. Confluent bilateral cerebral white matter hypodensity with hypodense heterogeneity in the deep white matter capsules and deep gray matter nuclei (especially the right thalamus). Stable gray-white matter differentiation throughout the brain. No midline shift, ventriculomegaly, mass effect, evidence of mass lesion, intracranial hemorrhage or evidence of cortically based acute infarction. No cortical encephalomalacia identified. Vascular: Calcified atherosclerosis at the skull base. No suspicious intracranial vascular hyperdensity. Skull: Stable since 2017, including pitting of the middle cranial fossa. No acute osseous abnormality identified. Sinuses/Orbits: Visualized paranasal  sinuses and mastoids are stable and well pneumatized. Other: No acute orbit or scalp soft tissue findings. ASPECTS [facility] Stroke Program Early CT Score) Total score (0-10 with 10 being normal): 10 IMPRESSION: Stable non contrast CT appearance of advanced small vessel disease since last month. No acute cortically based infarct or intracranial hemorrhage identified. ASPECTS 10. Electronically Signed: By: Genevie Deidre M.D. On: 07/06/2020 00:52    EKG: Independently reviewed. Interpretation : NSR with LBBB  Assessment/Plan 79 year old female with paroxysmal A. fib not on anticoagulation, diastolic heart failure, pacemaker placement for sick sinus syndrome, uncontrolled hypertension, CAD and history of breast cancer, as well as remote history of a prior seizure not on antiepileptics, who was brought in by EMS after a witnessed seizure at home while daughter was helping her to the bathroom.  Systolic blood pressure 381 with EMS    Seizure [facility]), in the setting of history of prior seizure -History of prior seizure, not on antiepileptics, believe related to metabolic derangements from HTN and DM at the time -Suspect related to elevated blood pressure with systolic over 829 with EMS -Ativan as needed seizure -EEG and neurology consult  Hypertensive emergency/encephalopathy, without CHF -Continue nicardipine infusion and wean as tolerated to keep systolic under 937 -Continue home antihypertensives    Hyperglycemia due to type 2 diabetes mellitus (HCC) -Blood sugar 458 -Sliding scale insulin coverage.  Continue home Lantus pending med rec    History of CVA (cerebrovascular accident) -Continue aspirin and atorvastatin    CAD (coronary artery disease) -No complaints of chest pain -Continue aspirin, atorvastatin and carvedilol  AF (paroxysmal atrial fibrillation) (HCC) -Continue carvedilol and aspirin.  Patient is not on anticoagulants    Pacemaker -No acute concerns        DVT prophylaxis:  Lovenox  Code Status: full code  Family Communication:  none  Disposition Plan: Back to previous home environment Consults called: Neurology Status:obs    Athena Masse MD [facility]     07/06/2020, 2:15 AM

## 2020-07-07 DIAGNOSIS — R569 Unspecified convulsions: Secondary | ICD-10-CM | POA: Diagnosis not present

## 2020-07-07 DIAGNOSIS — I480 Paroxysmal atrial fibrillation: Secondary | ICD-10-CM | POA: Diagnosis not present

## 2020-07-07 DIAGNOSIS — I161 Hypertensive emergency: Secondary | ICD-10-CM | POA: Diagnosis not present

## 2020-07-07 DIAGNOSIS — Z8673 Personal history of transient ischemic attack (TIA), and cerebral infarction without residual deficits: Secondary | ICD-10-CM | POA: Diagnosis not present

## 2020-07-07 LAB — GLUCOSE, CAPILLARY
Glucose-Capillary: 255 mg/dL — ABNORMAL HIGH (ref 70–99)
Glucose-Capillary: 291 mg/dL — ABNORMAL HIGH (ref 70–99)

## 2020-07-07 LAB — URINALYSIS, COMPLETE (UACMP) WITH MICROSCOPIC
Bilirubin Urine: NEGATIVE
Glucose, UA: >=500 mg/dL — AB
Ketones, ur: 5 mg/dL — AB
Leukocytes,Ua: NEGATIVE
Nitrite: NEGATIVE
Protein, ur: NEGATIVE mg/dL
Specific Gravity, Urine: 1.014 (ref 1.005–1.030)
pH: 6 (ref 5.0–8.0)

## 2020-07-07 NOTE — Progress Notes (Signed)
Subjective: No further seizures overnight.   Past Medical History:  Diagnosis Date  . Breast cancer ([place])   . CAD (coronary artery disease)   . Diastolic CHF ([place])   . Hypertension   . Pacemaker   . Paroxysmal atrial fibrillation [facility])     Past Surgical History:  Procedure Laterality Date  . PACEMAKER IMPLANT      Family history: Parents deceased.  Maternal aunt with cancer.    Social History:  reports that she has never smoked. She has never used smokeless tobacco. She reports previous alcohol use. She reports previous drug use.  Allergies  Allergen Reactions  . Penicillins Hives  . Simvastatin Hives and Other (See Comments)  . Iron Itching and Other (See Comments)    Pt. States doesn't bother Pt. States doesn't bother     Medications: I have reviewed the patient's current medications. Prior to Admission medications   Medication Sig Start Date End Date Taking? Authorizing Provider  acetaminophen (TYLENOL) 650 MG CR tablet Take 650 mg by mouth every 8 (eight) hours as needed for pain. 08/18/17  Yes [provider]  albuterol (PROVENTIL) (2.5 MG/3ML) 0.083% nebulizer solution Inhale 2.5 mg into the lungs every 4 (four) hours as needed. 05/20/11  Yes [provider]  albuterol (VENTOLIN HFA) 108 (90 Base) MCG/ACT inhaler Inhale 2 puffs into the lungs every 6 (six) hours as needed for wheezing or shortness of breath.   Yes [provider]  amLODipine (NORVASC) 10 MG tablet Take 10 mg by mouth daily.  05/28/20  Yes [provider]  aspirin 81 MG EC tablet Take 81 mg by mouth daily.   Yes [provider]  atorvastatin (LIPITOR) 80 MG tablet Take 80 mg by mouth daily. 05/27/20  Yes [provider]  carvedilol (COREG) 25 MG tablet Take 25 mg by mouth 2 (two) times daily. 05/27/20  Yes [provider]  LANTUS 100 UNIT/ML injection Inject 15 Units into the skin at bedtime. 02/19/20  Yes [provider]  lisinopril  (ZESTRIL) 20 MG tablet Take 20 mg by mouth daily. 05/28/20  Yes [provider]  nitroGLYCERIN (NITROSTAT) 0.4 MG SL tablet Place 0.4 mg under the tongue See admin instructions. Place 1 tablet (0.5 mg) under the tongue every five minutes as needed for chest pain. May take up to three doses.   Yes [provider]  RYBELSUS 7 MG TABS Take 1 tablet by mouth daily. 06/23/20  Yes [provider]  insulin detemir (LEVEMIR) 100 UNIT/ML injection Inject 12 Units into the skin 2 (two) times daily. Patient not taking: Reported on 07/06/2020 03/26/19   [provider]    Neurological Examination   Mental Status: Alert, oriented, thought content appropriate.  Speech fluent  Cranial Nerves: II: Visual fields grossly normal, pupils equal, round, reactive to light and accommodation III,IV, VI: ptosis not present, extra-ocular motions intact bilaterally V,VII: smile symmetric, facial light touch sensation normal bilaterally VIII: hearing normal bilaterally IX,X: gag reflex present XI: bilateral shoulder shrug XII: midline tongue extension Motor: Able to lift all extremities against gravity with no focal weakness appreciated.   Sensory: Pinprick and light touch intact throughout, bilaterally Deep Tendon Reflexes: Symmetric throughout Plantars: Right: mute   Left: mute Cerebellar: Normal finger-to-nose and normal heel-to-shin testing bilaterally Gait: not tested due to safety concerns    Laboratory Studies:   Basic Metabolic Panel: Recent Labs  Lab 07/06/20 0053 07/06/20 0251  NA 142  --   K 4.0  --  CL 104  --   CO2 29  --   GLUCOSE 458*  --   BUN 14  --   CREATININE 1.01* 0.83  CALCIUM 8.9  --     Liver Function Tests: Recent Labs  Lab 07/06/20 0053  AST 17  ALT 14  ALKPHOS 64  BILITOT 0.5  PROT 8.0  ALBUMIN 4.3   No results for input(s): LIPASE, AMYLASE in the last 168 hours. No results for input(s): AMMONIA in the last 168  hours.  CBC: Recent Labs  Lab 07/06/20 0053 07/06/20 0251  WBC 5.7 7.9  NEUTROABS 3.1  --   HGB 15.6* 15.2*  HCT 45.8 45.0  MCV 85.8 84.9  PLT 227 217    Cardiac Enzymes: No results for input(s): CKTOTAL, CKMB, CKMBINDEX, TROPONINI in the last 168 hours.  BNP: Invalid input(s): POCBNP  CBG: Recent Labs  Lab 07/06/20 0922 07/06/20 1354 07/06/20 1734 07/06/20 2210 07/07/20 0925  GLUCAP 293* 288* 194* 320* 255*    Microbiology: Results for orders placed or performed during the hospital encounter of 07/06/20  SARS Coronavirus 2 by RT PCR (hospital order, performed in [facility] hospital lab) Nasopharyngeal Nasopharyngeal Swab     Status: None   Collection Time: 07/06/20  1:39 AM   Specimen: Nasopharyngeal Swab  Result Value Ref Range Status   SARS Coronavirus 2 NEGATIVE NEGATIVE Final    Comment: (NOTE) SARS-CoV-2 target nucleic acids are NOT DETECTED.  The SARS-CoV-2 RNA is generally detectable in upper and lower respiratory specimens during the acute phase of infection. The lowest concentration of SARS-CoV-2 viral copies this assay can detect is 250 copies / mL. A negative result does not preclude SARS-CoV-2 infection and should not be used as the sole basis for treatment or other patient management decisions.  A negative result may occur with improper specimen collection / handling, submission of specimen other than nasopharyngeal swab, presence of viral mutation(s) within the areas targeted by this assay, and inadequate number of viral copies (<250 copies / mL). A negative result must be combined with clinical observations, patient history, and epidemiological information.  Fact Sheet for Patients:   StrictlyIdeas.no  Fact Sheet for Healthcare Providers: BankingDealers.co.za  This test is not yet approved or  cleared by the [place] FDA and has been authorized for detection and/or diagnosis of SARS-CoV-2  by FDA under an Emergency Use Authorization (EUA).  This EUA will remain in effect (meaning this test can be used) for the duration of the COVID-19 declaration under Section 564(b)(1) of the Act, 21 U.S.C. section 360bbb-3(b)(1), unless the authorization is terminated or revoked sooner.  Performed at [facility] [address] [place],  [address]     Coagulation Studies: Recent Labs    07/06/20 0053  LABPROT 11.7  INR 0.9    Urinalysis:  Recent Labs  Lab 07/06/20 0630  COLORURINE YELLOW*  LABSPEC 1.014  PHURINE 6.0  GLUCOSEU >=500*  HGBUR MODERATE*  BILIRUBINUR NEGATIVE  KETONESUR 5*  PROTEINUR NEGATIVE  NITRITE NEGATIVE  LEUKOCYTESUR NEGATIVE    Lipid Panel:  No results found for: CHOL, TRIG, HDL, CHOLHDL, VLDL, LDLCALC  HgbA1C: No results found for: HGBA1C  Urine Drug Screen:  No results found for: LABOPIA, COCAINSCRNUR, LABBENZ, AMPHETMU, THCU, LABBARB  Alcohol Level: No results for input(s): ETH in the last 168 hours.   Imaging: CT HEAD CODE STROKE WO CONTRAST`  Addendum Date: 07/06/2020   ADDENDUM REPORT: 07/06/2020 01:06 ADDENDUM: Study discussed by telephone with Dr. Rudene Re  on 07/06/2020 at 0055 hours. Electronically Signed   By: Genevie Adamarys M.D.   On: 07/06/2020 01:06   Result Date: 07/06/2020 CLINICAL DATA:  Code stroke. 79 year old female status post seizure like activity. EXAM: CT HEAD WITHOUT CONTRAST TECHNIQUE: Contiguous axial images were obtained from the base of the skull through the vertex without intravenous contrast. COMPARISON:  [facility] head CT 05/30/2020, and earlier. FINDINGS: Brain: No midline shift, mass effect, or evidence of intracranial mass lesion. No ventriculomegaly. No acute intracranial hemorrhage identified. Confluent bilateral cerebral white matter hypodensity with hypodense heterogeneity in the deep white matter capsules and deep gray matter nuclei (especially the right thalamus). Stable  gray-white matter differentiation throughout the brain. No midline shift, ventriculomegaly, mass effect, evidence of mass lesion, intracranial hemorrhage or evidence of cortically based acute infarction. No cortical encephalomalacia identified. Vascular: Calcified atherosclerosis at the skull base. No suspicious intracranial vascular hyperdensity. Skull: Stable since 2017, including pitting of the middle cranial fossa. No acute osseous abnormality identified. Sinuses/Orbits: Visualized paranasal sinuses and mastoids are stable and well pneumatized. Other: No acute orbit or scalp soft tissue findings. ASPECTS [facility] Stroke Program Early CT Score) Total score (0-10 with 10 being normal): 10 IMPRESSION: Stable non contrast CT appearance of advanced small vessel disease since last month. No acute cortically based infarct or intracranial hemorrhage identified. ASPECTS 10. Electronically Signed: By: Genevie Kristyana M.D. On: 07/06/2020 00:52     Assessment/Plan:  79 y.o. female with medical history significant for paroxysmal A. fib not on anticoagulation, diastolic heart failure, pacemaker placement for sick sinus syndrome, uncontrolled hypertension, CAD and history of breast cancer, as well as remote history of a prior seizure not on antiepileptics, who was brought in by EMS after a witnessed seizure at home while daughter was helping her to the bathroom.  Patient was postictal by arrival of EMS and EMS reported a systolic blood pressure of over 250.  She received IV labetalol in route and Nitropaste. Patient reports that she had a seizure in March of this year.  Was felt to be secondary to her other medical problems and she was not started on anticonvulsant therapy. Glucose over 400 on admission   - No seizures overnight.  - Unable to obtain MRI due to PPM - I think is is provoked event similar to last time - d/c planning today - No anti epileptics

## 2020-07-07 NOTE — ED Notes (Signed)
MD Ayiku at bedside. 

## 2020-07-07 NOTE — Progress Notes (Signed)
eeg completed ° °

## 2020-07-07 NOTE — ED Notes (Signed)
Report off to paige rn

## 2020-07-07 NOTE — Discharge Summary (Addendum)
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  Diet recommendation:  Diet Order            Diet - low sodium heart healthy           Diet Carb Modified           DIET SOFT Room service appropriate? Yes; Fluid consistency: Thin  Diet effective now                   Code Status: Full Code     Hospital Course:   Tara Sandoval is a 79 y.o. female with medical history significant for paroxysmal A. fib not on anticoagulation, history of stroke, chronic diastolic heart failure, pacemaker placement for sick sinus syndrome, uncontrolled hypertension, CAD and history of breast cancer, remote history of a prior seizure not on antiepileptics, who was brought in by EMS after a witnessed seizure at home while daughter was helping her to the bathroom.  Patient was postictal by arrival of EMS and EMS reported a systolic blood pressure of over 250.  She received IV labetalol in route and Nitropaste.  She was admitted to the hospital for seizure and hypertensive encephalopathy.  She was treated with IV nicardipine infusion because of hypertensive emergency.  Her home antihypertensives were resumed and she has been successfully weaned off of IV nicardipine infusion.  Patient said she had not taking her antihypertensives for about 4 months and this was confirmed by  her daughter.  She has been advised to resume home antihypertensives.  She said she has no other medicines at home and did not need any refills.  She was evaluated by the neurologist.  No antiepileptics were recommended.  Case was discussed with Dr. Irish Elders, neurologist, who said patient is okay for discharge from his standpoint.  She has history of atrial fibrillation listed under medical history. However, her daughter said she is not aware of this diagnosis. Discussed management of atrial fibrillation including anticoagulation for stroke prophylaxis with her. She said she will follow-up with her PCP and cardiologist to discuss this further.  Discharge plan was discussed with the patient and her daughter and they verbalized understanding.   Discharge Exam:   Vitals:   07/07/20 0820 07/07/20 1059  BP: 122/62 (!) 152/96  Pulse: 72   Resp: 16   Temp:    SpO2: 93%    Vitals:   07/07/20 0615 07/07/20 0820 07/07/20 1059 07/07/20 1400  BP: 130/69 122/62 (!) 152/96   Pulse: 60 72    Resp: 15 16    Temp:      TempSrc:      SpO2: 93% 93%    Height:    4' 11.84" (1.52 m)     GEN: NAD SKIN: Warm and dry EYES: EOMI ENT: MMM CV: RRR PULM: CTA B ABD: soft, ND, NT, +BS CNS: AAO x  3, non focal EXT: No edema or tenderness   The results of significant diagnostics from this hospitalization (including imaging, microbiology, ancillary and laboratory) are listed below for reference.     Procedures and Diagnostic Studies:   CT HEAD CODE STROKE WO CONTRAST`  Addendum Date: 07/06/2020   ADDENDUM REPORT: 07/06/2020 01:06 ADDENDUM: Study discussed by telephone with Dr. Rudene Re on 07/06/2020 at 0055 hours. Electronically Signed   By: Genevie Icey M.D.   On: 07/06/2020 01:06   Result Date: 07/06/2020 CLINICAL DATA:  Code stroke. 79 year old female status post seizure like activity. EXAM: CT HEAD WITHOUT CONTRAST TECHNIQUE: Contiguous axial images were obtained from the base of the skull  through the vertex without intravenous contrast. COMPARISON:  [facility] head CT 05/30/2020, and earlier. FINDINGS: Brain: No midline shift, mass effect, or evidence of intracranial mass lesion. No ventriculomegaly. No acute intracranial hemorrhage identified. Confluent bilateral cerebral white matter hypodensity with hypodense heterogeneity in the deep white matter capsules and deep gray matter nuclei (especially the right thalamus). Stable gray-white matter differentiation throughout the brain. No midline shift, ventriculomegaly, mass effect, evidence of mass lesion, intracranial hemorrhage or evidence of cortically based acute infarction. No cortical encephalomalacia identified. Vascular: Calcified atherosclerosis at the skull base. No suspicious intracranial vascular hyperdensity. Skull: Stable since 2017, including pitting of the middle cranial fossa. No acute osseous abnormality identified. Sinuses/Orbits: Visualized paranasal sinuses and mastoids are stable and well pneumatized. Other: No acute orbit or scalp soft tissue findings. ASPECTS [facility] Stroke Program Early CT Score) Total score (0-10 with 10 being normal): 10 IMPRESSION: Stable non contrast CT appearance of advanced small vessel disease since last month. No acute cortically based infarct or intracranial hemorrhage identified. ASPECTS 10. Electronically Signed: By: Genevie Gertrude M.D. On: 07/06/2020 00:52     Labs:   Basic Metabolic Panel: Recent Labs  Lab 07/06/20 0053 07/06/20 0251  NA 142  --   K 4.0  --   CL 104  --   CO2 29  --   GLUCOSE 458*  --   BUN 14  --   CREATININE 1.01* 0.83  CALCIUM 8.9  --    GFR CrCl cannot be calculated (Unknown ideal weight.). Liver Function Tests: Recent Labs  Lab 07/06/20 0053  AST 17  ALT 14  ALKPHOS 64  BILITOT 0.5  PROT 8.0  ALBUMIN 4.3   No results for input(s): LIPASE, AMYLASE in the last 168 hours. No results for input(s): AMMONIA in the last 168 hours. Coagulation  profile Recent Labs  Lab 07/06/20 0053  INR 0.9    CBC: Recent Labs  Lab 07/06/20 0053 07/06/20 0251  WBC 5.7 7.9  NEUTROABS 3.1  --   HGB 15.6* 15.2*  HCT 45.8 45.0  MCV 85.8 84.9  PLT 227 217   Cardiac Enzymes: No results for input(s): CKTOTAL, CKMB, CKMBINDEX, TROPONINI in the last 168 hours. BNP: Invalid input(s): POCBNP CBG: Recent Labs  Lab 07/06/20 1354 07/06/20 1734 07/06/20 2210 07/07/20 0925 07/07/20 1304  GLUCAP 288* 194* 320* 255* 291*   D-Dimer No results for input(s): DDIMER in the last 72 hours. Hgb A1c No results for input(s): HGBA1C in the last 72 hours. Lipid Profile No results for input(s): CHOL, HDL, LDLCALC, TRIG, CHOLHDL, LDLDIRECT in the last 72 hours. Thyroid function studies No results for input(s): TSH, T4TOTAL, T3FREE, THYROIDAB in the last 72 hours.  Invalid input(s): FREET3 Anemia work up No results for input(s): VITAMINB12, FOLATE, FERRITIN, TIBC, IRON, RETICCTPCT in the  last 72 hours. Microbiology Recent Results (from the past 240 hour(s))  SARS Coronavirus 2 by RT PCR (hospital order, performed in [facility] hospital lab) Nasopharyngeal Nasopharyngeal Swab     Status: None   Collection Time: 07/06/20  1:39 AM   Specimen: Nasopharyngeal Swab  Result Value Ref Range Status   SARS Coronavirus 2 NEGATIVE NEGATIVE Final    Comment: (NOTE) SARS-CoV-2 target nucleic acids are NOT DETECTED.  The SARS-CoV-2 RNA is generally detectable in upper and lower respiratory specimens during the acute phase of infection. The lowest concentration of SARS-CoV-2 viral copies this assay can detect is 250 copies / mL. A negative result does not preclude SARS-CoV-2 infection and should not be used as the sole basis for treatment or other patient management decisions.  A negative result may occur with improper specimen collection / handling, submission of specimen other than nasopharyngeal swab, presence of viral mutation(s) within the areas  targeted by this assay, and inadequate number of viral copies (<250 copies / mL). A negative result must be combined with clinical observations, patient history, and epidemiological information.  Fact Sheet for Patients:   StrictlyIdeas.no  Fact Sheet for Healthcare Providers: BankingDealers.co.za  This test is not yet approved or  cleared by the [place] FDA and has been authorized for detection and/or diagnosis of SARS-CoV-2 by FDA under an Emergency Use Authorization (EUA).  This EUA will remain in effect (meaning this test can be used) for the duration of the COVID-19 declaration under Section 564(b)(1) of the Act, 21 U.S.C. section 360bbb-3(b)(1), unless the authorization is terminated or revoked sooner.  Performed at [facility], [address], [place], [place] [address]      Discharge Instructions:   Discharge Instructions    Diet - low sodium heart healthy   Complete by: As directed    Diet Carb Modified   Complete by: As directed    Face-to-face encounter (required for Medicare/Medicaid patients)   Complete by: As directed    I [place] certify that this patient is under my care and that I, or a nurse practitioner or physician's assistant working with me, had a face-to-face encounter that meets the physician face-to-face encounter requirements with this patient on 07/07/2020. The encounter with the patient was in whole, or in part for the following medical condition(s) which is the primary reason for home health care (List medical condition):  Debility   The encounter with the patient was in whole, or in part, for the following medical condition, which is the primary reason for home health care: Debility   I certify that, based on my findings, the following services are medically necessary home health services: Physical therapy   Reason for Medically Necessary Home Health Services: Therapy- Personnel officer,  Public librarian   My clinical findings support the need for the above services: Unable to leave home safely without assistance and/or assistive device   Further, I certify that my clinical findings support that this patient is homebound due to: Unable to leave home safely without assistance   Home Health   Complete by: As directed    To provide the following care/treatments: PT   Increase activity slowly   Complete by: As directed      Allergies as of 07/07/2020      Reactions   Penicillins Hives   Simvastatin Hives, Other (See Comments)   Iron Itching, Other (See Comments)   Pt. States doesn't bother Pt. States doesn't bother  Medication List    STOP taking these medications   insulin detemir 100 UNIT/ML injection Commonly known as: LEVEMIR     TAKE these medications   acetaminophen 650 MG CR tablet Commonly known as: TYLENOL Take 650 mg by mouth every 8 (eight) hours as needed for pain.   albuterol 108 (90 Base) MCG/ACT inhaler Commonly known as: VENTOLIN HFA Inhale 2 puffs into the lungs every 6 (six) hours as needed for wheezing or shortness of breath.   albuterol (2.5 MG/3ML) 0.083% nebulizer solution Commonly known as: PROVENTIL Inhale 2.5 mg into the lungs every 4 (four) hours as needed.   amLODipine 10 MG tablet Commonly known as: NORVASC Take 10 mg by mouth daily.   aspirin 81 MG EC tablet Take 81 mg by mouth daily.   atorvastatin 80 MG tablet Commonly known as: LIPITOR Take 80 mg by mouth daily.   carvedilol 25 MG tablet Commonly known as: COREG Take 25 mg by mouth 2 (two) times daily.   Lantus 100 UNIT/ML injection Generic drug: insulin glargine Inject 15 Units into the skin at bedtime.   lisinopril 20 MG tablet Commonly known as: ZESTRIL Take 20 mg by mouth daily.   nitroGLYCERIN 0.4 MG SL tablet Commonly known as: NITROSTAT Place 0.4 mg under the tongue See admin instructions. Place 1 tablet (0.5 mg) under the tongue  every five minutes as needed for chest pain. May take up to three doses.   Rybelsus 7 MG Tabs Generic drug: Semaglutide Take 1 tablet by mouth daily.         Time coordinating discharge: 32 minutes  Signed:  Sharrell Krawiec  [facility] 07/07/2020, 5:06 PM

## 2020-07-07 NOTE — Progress Notes (Signed)
Inpatient Diabetes Program Recommendations  AACE/ADA: New Consensus Statement on Inpatient Glycemic Control   Target Ranges:  Prepandial:   less than 140 mg/dL      Peak postprandial:   less than 180 mg/dL (1-2 hours)      Critically ill patients:  140 - 180 mg/dL   Results for Tara Sandoval, [place] (MRN [id number]) as of 07/07/2020 09:27  Ref. Range 07/06/2020 00:25 07/06/2020 09:22 07/06/2020 13:54 07/06/2020 17:34 07/06/2020 22:10 07/07/2020 09:25  Glucose-Capillary Latest Ref Range: 70 - 99 mg/dL 433 (H) 293 (H) 288 (H) 194 (H) 320 (H) 255 (H)   Review of Glycemic Control  Diabetes history: DM2 Outpatient Diabetes medications: Lantus 15 units QHS, Rybelsus 7 mg daily Current orders for Inpatient glycemic control: Lantus 15 units QHS, Novolog 0-15 units TID with meals, Novolog 0-5 units QHS  Inpatient Diabetes Program Recommendations:    Insulin-Please consider increasing Lantus to 18 units QHS and Novolog 3 units TID with meals if patient eats at least 50% of meals.  Thanks, Barnie Alderman, RN, MSN, CDE Diabetes Coordinator Inpatient Diabetes Program [phone] (Team Pager from 8am to 5pm)

## 2020-07-07 NOTE — ED Notes (Signed)
Pt able to ambulate to toilet with minimum assistance by this RN.

## 2020-07-07 NOTE — ED Notes (Signed)
Pt sleeping. 

## 2020-07-07 NOTE — ED Notes (Signed)
Lunch tray at bedside. ?

## 2020-07-07 NOTE — ED Notes (Signed)
Pt's daughter contacted by this RN to provide status update and DC.

## 2020-07-07 NOTE — ED Notes (Signed)
Blankets provided to pt.

## 2020-07-07 NOTE — Evaluation (Signed)
Physical Therapy Evaluation Patient Details Name: Tara Sandoval MRN: [id number] DOB: 10-16-1941 Today's Date: 07/07/2020   History of Present Illness  Pt is an 79 y.o. female presenting to hospital 7/12 with 2-3 minute generalized tonic clonic seizure while daughter helping her to bathroom; with EMS pt postictal and SBP over 250.  Pt admitted with seizure, hypertensive emergency/encephalopathy, and hyperglycemia.  PMH includes DM, CVA, breast CA, htn, asthma, SSS s/p pacemaker, and CHF.  Clinical Impression  Prior to hospital admission, pt reports being ambulatory; lives with family in 1 level home (level entry).  Currently pt is modified independent with bed mobility; CGA with transfers; and CGA ambulating 60 feet with RW.  Overall pt steady and safe with functional mobility using RW during sessions activities.  BP 148/68 resting in bed beginning of session and 164/71 end of session at rest (nurse notified).  Pt would benefit from skilled PT to address noted impairments and functional limitations (see below for any additional details).  Upon hospital discharge, pt would benefit from [place].    Follow Up Recommendations Home health PT    Equipment Recommendations  Other (comment) (pt has RW and BSC at home)    Recommendations for Other Services       Precautions / Restrictions Precautions Precautions: Fall Precaution Comments: seizure precautions Restrictions Weight Bearing Restrictions: No      Mobility  Bed Mobility Overal bed mobility: Modified Independent             General bed mobility comments: Semi-supine to/from sitting with mild increased effort/time to perform on own  Transfers Overall transfer level: Needs assistance Equipment used: Rolling walker (2 wheeled) Transfers: Sit to/from Stand Sit to Stand: Min guard         General transfer comment: steady transfers noted with RW  Ambulation/Gait Ambulation/Gait assistance: Min guard Gait Distance (Feet): 60  Feet (in hospital room) Assistive device: Rolling walker (2 wheeled)   Gait velocity: decreased   General Gait Details: partial step through gait pattern; steady with RW  Stairs            Wheelchair Mobility    Modified Rankin (Stroke Patients Only)       Balance Overall balance assessment: Needs assistance Sitting-balance support: No upper extremity supported;Feet supported Sitting balance-Leahy Scale: Normal Sitting balance - Comments: steady sitting reaching outside BOS   Standing balance support: Single extremity supported Standing balance-Leahy Scale: Good Standing balance comment: steady standing reaching outside BOS with single UE support                             Pertinent Vitals/Pain Pain Assessment: No/denies pain  Vitals (HR and O2 on room air) stable and WFL throughout treatment session.    Home Living Family/patient expects to be discharged to:: Private residence Living Arrangements: Children;Other relatives (pt's daughter, 2 and 44 y.o. twin boys and girls, and additional family) Available Help at Discharge: Family Type of Home: House Home Access: Level entry     Home Layout: One level Home Equipment: Cane - single point;Grab bars - toilet;Grab bars - tub/shower;Walker - 2 wheels;Bedside commode;Shower seat      Prior Function Level of Independence: Independent         Comments: Uses SPC when going out     Hand Dominance        Extremity/Trunk Assessment   Upper Extremity Assessment Upper Extremity Assessment: Generalized weakness    Lower Extremity Assessment Lower  Extremity Assessment: Generalized weakness    Cervical / Trunk Assessment Cervical / Trunk Assessment: Normal  Communication   Communication: No difficulties  Cognition Arousal/Alertness: Awake/alert Behavior During Therapy: WFL for tasks assessed/performed                                   General Comments: Oriented to person, place,  general situation, and month      General Comments   Nursing cleared pt for participation in physical therapy.  Pt agreeable to PT session.    Exercises  Transfer and gait training with RW   Assessment/Plan    PT Assessment Patient needs continued PT services  PT Problem List Decreased strength;Decreased activity tolerance;Decreased balance;Decreased mobility;Decreased knowledge of use of DME       PT Treatment Interventions DME instruction;Gait training;Functional mobility training;Therapeutic activities;Therapeutic exercise;Balance training;Patient/family education    PT Goals (Current goals can be found in the Care Plan section)  Acute Rehab PT Goals Patient Stated Goal: to go home PT Goal Formulation: With patient Time For Goal Achievement: 07/21/20 Potential to Achieve Goals: Good    Frequency Min 2X/week   Barriers to discharge        Co-evaluation               AM-PAC PT "6 Clicks" Mobility  Outcome Measure Help needed turning from your back to your side while in a flat bed without using bedrails?: None Help needed moving from lying on your back to sitting on the side of a flat bed without using bedrails?: None Help needed moving to and from a bed to a chair (including a wheelchair)?: A Little Help needed standing up from a chair using your arms (e.g., wheelchair or bedside chair)?: A Little Help needed to walk in hospital room?: A Little Help needed climbing 3-5 steps with a railing? : A Lot 6 Click Score: 19    End of Session Equipment Utilized During Treatment: Gait belt Activity Tolerance: Patient tolerated treatment well Patient left: in bed;with call bell/phone within reach (in stretcher bed with B railings up; call light in reach; stretcher bed in lowest position) Nurse Communication: Mobility status;Precautions (pt's BP post ambulation and purewick removed) PT Visit Diagnosis: Other abnormalities of gait and mobility (R26.89);Muscle weakness  (generalized) (M62.81)    Time: 2536-6440 PT Time Calculation (min) (ACUTE ONLY): 38 min   Charges:   PT Evaluation $PT Eval Low Complexity: 1 Low PT Treatments $Gait Training: 8-22 mins $Therapeutic Activity: 8-22 mins       Leitha Bleak, PT 07/07/20, 3:15 PM

## 2020-07-07 NOTE — Procedures (Signed)
Patient Name: Tara Sandoval  MRN: [id number]  Epilepsy Attending: Lora Havens  Referring Physician/Provider: Dr Judd Gaudier Date: 07/07/2020  Duration: 21.08 mins  Patient history:  79 y.o. female with medical history significant forparoxysmal A. fib not on anticoagulation, diastolic heart failure, pacemaker placement for sick sinus syndrome, uncontrolled hypertension, CAD and history of breast cancer, as well as remote history of a prior seizure not on antiepileptics, who was brought in by EMS after a witnessed seizure at home while daughter was helping her to the bathroom. Patient was postictal by arrival of EMS and EMS reported a systolic blood pressure of over 250. She received IV labetalol in route and Nitropaste. Patient reports that she had a seizure in March of this year.  Was felt to be secondary to her other medical problems and she was not started on anticonvulsant therapy. Glucose over 400 on admission. EEG to evaluate for seizure.  Level of alertness: Awake  AEDs during EEG study: None  Technical aspects: This EEG study was done with scalp electrodes positioned according to the 10-20 International system of electrode placement. Electrical activity was acquired at a sampling rate of 500Hz  and reviewed with a high frequency filter of 70Hz  and a low frequency filter of 1Hz . EEG data were recorded continuously and digitally stored.   Description: The posterior dominant rhythm consists of 9 Hz activity of moderate voltage (25-35 uV) seen predominantly in posterior head regions, symmetric and reactive to eye opening and eye closing. EEG showed intermittent generalized 3 to 6 Hz theta-delta slowing.  Physiology photic driving was not seen during photic stimulation.  Hyperventilation was not performed.     ABNORMALITY -Intermittent slow, generalized  IMPRESSION: This study is suggestive of mild diffuse encephalopathy, nonspecific etiology. No seizures or epileptiform discharges were  seen throughout the recording.  Tara Sandoval [name]

## 2020-07-08 LAB — HEMOGLOBIN A1C
Hgb A1c MFr Bld: 12.5 % — ABNORMAL HIGH (ref 4.8–5.6)
Mean Plasma Glucose: 312 mg/dL

## 2020-11-30 ENCOUNTER — Encounter
Admission: EM | Disposition: A | Discharge status: Home / Self Care | Payer: Self-pay | Source: Home / Self Care | Attending: Internal Medicine

## 2020-11-30 ENCOUNTER — Emergency Department: Payer: Medicare Other

## 2020-11-30 ENCOUNTER — Observation Stay
Admission: EM | Admit: 2020-11-30 | Discharge: 2020-12-01 | Disposition: A | Discharge status: Home / Self Care | Payer: Medicare Other | Attending: Internal Medicine | Admitting: Internal Medicine

## 2020-11-30 ENCOUNTER — Encounter: Payer: Self-pay | Admitting: Emergency Medicine

## 2020-11-30 DIAGNOSIS — Z20822 Contact with and (suspected) exposure to covid-19: Secondary | ICD-10-CM | POA: Insufficient documentation

## 2020-11-30 DIAGNOSIS — I214 Non-ST elevation (NSTEMI) myocardial infarction: Principal | ICD-10-CM | POA: Insufficient documentation

## 2020-11-30 DIAGNOSIS — I160 Hypertensive urgency: Secondary | ICD-10-CM

## 2020-11-30 DIAGNOSIS — R9431 Abnormal electrocardiogram [ECG] [EKG]: Secondary | ICD-10-CM

## 2020-11-30 DIAGNOSIS — I110 Hypertensive heart disease with heart failure: Secondary | ICD-10-CM | POA: Insufficient documentation

## 2020-11-30 DIAGNOSIS — Z950 Presence of cardiac pacemaker: Secondary | ICD-10-CM | POA: Insufficient documentation

## 2020-11-30 DIAGNOSIS — I5032 Chronic diastolic (congestive) heart failure: Secondary | ICD-10-CM | POA: Diagnosis not present

## 2020-11-30 DIAGNOSIS — E1165 Type 2 diabetes mellitus with hyperglycemia: Secondary | ICD-10-CM | POA: Insufficient documentation

## 2020-11-30 DIAGNOSIS — I5030 Unspecified diastolic (congestive) heart failure: Secondary | ICD-10-CM | POA: Diagnosis not present

## 2020-11-30 DIAGNOSIS — Z79899 Other long term (current) drug therapy: Secondary | ICD-10-CM | POA: Diagnosis not present

## 2020-11-30 DIAGNOSIS — Z853 Personal history of malignant neoplasm of breast: Secondary | ICD-10-CM | POA: Diagnosis not present

## 2020-11-30 DIAGNOSIS — I2510 Atherosclerotic heart disease of native coronary artery without angina pectoris: Secondary | ICD-10-CM | POA: Insufficient documentation

## 2020-11-30 DIAGNOSIS — Z7982 Long term (current) use of aspirin: Secondary | ICD-10-CM | POA: Diagnosis not present

## 2020-11-30 DIAGNOSIS — R0602 Shortness of breath: Secondary | ICD-10-CM | POA: Diagnosis present

## 2020-11-30 DIAGNOSIS — J9611 Chronic respiratory failure with hypoxia: Secondary | ICD-10-CM | POA: Insufficient documentation

## 2020-11-30 LAB — CBC WITH DIFFERENTIAL/PLATELET
Abs Immature Granulocytes: 0.03 10*3/uL (ref 0.00–0.07)
Basophils Absolute: 0 10*3/uL (ref 0.0–0.1)
Basophils Relative: 0 %
Eosinophils Absolute: 0.1 10*3/uL (ref 0.0–0.5)
Eosinophils Relative: 1 %
HCT: 48.1 % — ABNORMAL HIGH (ref 36.0–46.0)
Hemoglobin: 16.2 g/dL — ABNORMAL HIGH (ref 12.0–15.0)
Immature Granulocytes: 0 %
Lymphocytes Relative: 11 %
Lymphs Abs: 1 10*3/uL (ref 0.7–4.0)
MCH: 29.3 pg (ref 26.0–34.0)
MCHC: 33.7 g/dL (ref 30.0–36.0)
MCV: 87.1 fL (ref 80.0–100.0)
Monocytes Absolute: 0.2 10*3/uL (ref 0.1–1.0)
Monocytes Relative: 2 %
Neutro Abs: 7.2 10*3/uL (ref 1.7–7.7)
Neutrophils Relative %: 86 %
Platelets: 238 10*3/uL (ref 150–400)
RBC: 5.52 MIL/uL — ABNORMAL HIGH (ref 3.87–5.11)
RDW: 13 % (ref 11.5–15.5)
WBC: 8.5 10*3/uL (ref 4.0–10.5)
nRBC: 0 % (ref 0.0–0.2)

## 2020-11-30 LAB — RESP PANEL BY RT-PCR (FLU A&B, COVID) ARPGX2
Influenza A by PCR: NEGATIVE
Influenza B by PCR: NEGATIVE
SARS Coronavirus 2 by RT PCR: NEGATIVE

## 2020-11-30 LAB — COMPREHENSIVE METABOLIC PANEL
ALT: 12 U/L (ref 0–44)
AST: 14 U/L — ABNORMAL LOW (ref 15–41)
Albumin: 4 g/dL (ref 3.5–5.0)
Alkaline Phosphatase: 62 U/L (ref 38–126)
Anion gap: 11 (ref 5–15)
BUN: 14 mg/dL (ref 8–23)
CO2: 28 mmol/L (ref 22–32)
Calcium: 9.3 mg/dL (ref 8.9–10.3)
Chloride: 100 mmol/L (ref 98–111)
Creatinine, Ser: 0.87 mg/dL (ref 0.44–1.00)
GFR, Estimated: >60 mL/min (ref >60)
Glucose, Bld: 392 mg/dL — ABNORMAL HIGH (ref 70–99)
Potassium: 4.2 mmol/L (ref 3.5–5.1)
Sodium: 139 mmol/L (ref 135–145)
Total Bilirubin: 0.5 mg/dL (ref 0.3–1.2)
Total Protein: 7.4 g/dL (ref 6.5–8.1)

## 2020-11-30 LAB — TROPONIN I (HIGH SENSITIVITY)
Troponin I (High Sensitivity): 79 ng/L — ABNORMAL HIGH (ref <18)
Troponin I (High Sensitivity): 111 ng/L (ref <18)

## 2020-11-30 LAB — FIBRIN DERIVATIVES D-DIMER (ARMC ONLY): Fibrin derivatives D-dimer (ARMC): 781.71 ng/mL (FEU) — ABNORMAL HIGH (ref 0.00–499.00)

## 2020-11-30 LAB — HEMOGLOBIN A1C
Hgb A1c MFr Bld: 11.7 % — ABNORMAL HIGH (ref 4.8–5.6)
Mean Plasma Glucose: 289.09 mg/dL

## 2020-11-30 LAB — PROTIME-INR
INR: 1 (ref 0.8–1.2)
Prothrombin Time: 13.1 seconds (ref 11.4–15.2)

## 2020-11-30 LAB — BRAIN NATRIURETIC PEPTIDE: B Natriuretic Peptide: 165.5 pg/mL — ABNORMAL HIGH (ref 0.0–100.0)

## 2020-11-30 LAB — MAGNESIUM: Magnesium: 2.8 mg/dL — ABNORMAL HIGH (ref 1.7–2.4)

## 2020-11-30 LAB — CBG MONITORING, ED: Glucose-Capillary: 378 mg/dL — ABNORMAL HIGH (ref 70–99)

## 2020-11-30 LAB — APTT: aPTT: 30 seconds (ref 24–36)

## 2020-11-30 SURGERY — INVASIVE LAB ABORTED CASE

## 2020-11-30 MED ORDER — CARVEDILOL 25 MG PO TABS
25.0000 mg | ORAL_TABLET | Freq: Two times a day (BID) | ORAL | Status: DC
  Administered 2020-11-30 – 2020-12-01 (×2): 25 mg via ORAL
  Filled 2020-11-30 (×2): qty 1

## 2020-11-30 MED ORDER — ONDANSETRON HCL 4 MG PO TABS: 4.0000 mg | ORAL_TABLET | Freq: Four times a day (QID) | ORAL | Status: DC | PRN

## 2020-11-30 MED ORDER — INSULIN GLARGINE 100 UNIT/ML ~~LOC~~ SOLN
10.0000 [IU] | Freq: Every day | SUBCUTANEOUS | Status: DC
  Filled 2020-11-30 (×2): qty 0.1

## 2020-11-30 MED ORDER — MIDAZOLAM HCL 2 MG/2ML IJ SOLN
INTRAMUSCULAR | Status: AC
  Filled 2020-11-30: qty 2

## 2020-11-30 MED ORDER — ONDANSETRON HCL 4 MG/2ML IJ SOLN: 4.0000 mg | Freq: Four times a day (QID) | INTRAMUSCULAR | Status: DC | PRN

## 2020-11-30 MED ORDER — INSULIN ASPART 100 UNIT/ML ~~LOC~~ SOLN
0.0000 [IU] | Freq: Three times a day (TID) | SUBCUTANEOUS | Status: DC
  Administered 2020-12-01 (×2): 7 [IU] via SUBCUTANEOUS
  Filled 2020-11-30 (×2): qty 1

## 2020-11-30 MED ORDER — HEPARIN SODIUM (PORCINE) 5000 UNIT/ML IJ SOLN
4000.0000 [IU] | Freq: Once | INTRAMUSCULAR | Status: AC
  Administered 2020-11-30: 4000 [IU] via INTRAVENOUS
  Filled 2020-11-30: qty 1

## 2020-11-30 MED ORDER — VERAPAMIL HCL 2.5 MG/ML IV SOLN
INTRAVENOUS | Status: AC
  Filled 2020-11-30: qty 2

## 2020-11-30 MED ORDER — HEPARIN (PORCINE) IN NACL 1000-0.9 UT/500ML-% IV SOLN
INTRAVENOUS | Status: AC
  Filled 2020-11-30: qty 1000

## 2020-11-30 MED ORDER — ALBUTEROL SULFATE HFA 108 (90 BASE) MCG/ACT IN AERS
2.0000 | INHALATION_SPRAY | Freq: Four times a day (QID) | RESPIRATORY_TRACT | Status: DC | PRN
  Filled 2020-11-30: qty 6.7

## 2020-11-30 MED ORDER — HEPARIN (PORCINE) 25000 UT/250ML-% IV SOLN
950.0000 [IU]/h | INTRAVENOUS | Status: DC
  Administered 2020-11-30: 750 [IU]/h via INTRAVENOUS
  Filled 2020-11-30: qty 250

## 2020-11-30 MED ORDER — ACETAMINOPHEN 650 MG RE SUPP: 650.0000 mg | Freq: Four times a day (QID) | RECTAL | Status: DC | PRN

## 2020-11-30 MED ORDER — ASPIRIN EC 81 MG PO TBEC
81.0000 mg | DELAYED_RELEASE_TABLET | Freq: Every day | ORAL | Status: DC
  Administered 2020-12-01: 81 mg via ORAL
  Filled 2020-11-30: qty 1

## 2020-11-30 MED ORDER — ASPIRIN 81 MG PO CHEW
324.0000 mg | CHEWABLE_TABLET | Freq: Once | ORAL | Status: AC
  Administered 2020-11-30: 324 mg via ORAL
  Filled 2020-11-30: qty 4

## 2020-11-30 MED ORDER — ATORVASTATIN CALCIUM 80 MG PO TABS
80.0000 mg | ORAL_TABLET | Freq: Every day | ORAL | Status: DC
  Administered 2020-11-30 – 2020-12-01 (×2): 80 mg via ORAL
  Filled 2020-11-30: qty 1
  Filled 2020-11-30: qty 4

## 2020-11-30 MED ORDER — HEPARIN (PORCINE) 25000 UT/250ML-% IV SOLN: 950.0000 [IU]/h | INTRAVENOUS | Status: DC

## 2020-11-30 MED ORDER — HEPARIN SODIUM (PORCINE) 1000 UNIT/ML IJ SOLN
INTRAMUSCULAR | Status: AC
  Filled 2020-11-30: qty 1

## 2020-11-30 MED ORDER — FENTANYL CITRATE (PF) 100 MCG/2ML IJ SOLN
INTRAMUSCULAR | Status: AC
  Filled 2020-11-30: qty 2

## 2020-11-30 MED ORDER — LISINOPRIL 20 MG PO TABS
20.0000 mg | ORAL_TABLET | Freq: Every day | ORAL | Status: DC
  Administered 2020-11-30: 20 mg via ORAL
  Filled 2020-11-30: qty 2

## 2020-11-30 MED ORDER — AMLODIPINE BESYLATE 10 MG PO TABS
10.0000 mg | ORAL_TABLET | Freq: Every day | ORAL | Status: DC
  Administered 2020-11-30 – 2020-12-01 (×2): 10 mg via ORAL
  Filled 2020-11-30: qty 1
  Filled 2020-11-30: qty 2

## 2020-11-30 MED ORDER — INSULIN ASPART 100 UNIT/ML ~~LOC~~ SOLN
0.0000 [IU] | Freq: Every day | SUBCUTANEOUS | Status: DC
  Administered 2020-11-30: 5 [IU] via SUBCUTANEOUS
  Filled 2020-11-30: qty 1

## 2020-11-30 MED ORDER — ACETAMINOPHEN 325 MG PO TABS: 650.0000 mg | ORAL_TABLET | Freq: Four times a day (QID) | ORAL | Status: DC | PRN

## 2020-11-30 NOTE — H&P (Signed)
History and Physical    Tara Sandoval:[id number] DOB: 1941/02/14 DOA: 11/30/2020  I have briefly reviewed the patient's prior medical records in [place]  PCP: System, Provider Not In  Patient coming from: home  Chief Complaint: Shortness of breath  HPI: Tara Sandoval is a 79 y.o. female with medical history significant of breast cancer, CAD, chronic diastolic CHF, hypertension, paroxysmal A. fib/pacemaker (per cardiology A. fib burden not long enough to justify anticoagulation), prior CVA, prior hospitalization earlier this year with hypertensive emergency, seizure disorder, comes to the hospital with complaints of shortness of breath. Patient was in her normal state of health this afternoon, was cooking dinner and all of a sudden experienced sudden onset shortness of breath with wheezing. She denies any chest pain or tightness in her chest. Denies any nausea, no vomiting, no abdominal pain. No fever or chills. No cough or chest congestion. She alerted family who then called EMS and she was brought to the hospital. There is a reported history of noncompliance with her medications, and she tells me that this morning she did not take her antihypertensives. She talks around and doesn't give me a straight answer as to why she didn't take them. Currently her shortness of breath has completely resolved  ED Course: In the ED initial EKG was concerning for left bundle branch block, possible STEMI and cardiology was consulted, evaluated patient, and STEMI code was canceled as the left bundle branch block was reported in her [facility] in July and she does not meet STEMI criteria by [place] criteria. She was found to have elevated blood pressure in the 160s, she is afebrile and satting well on room air but was put on 3 L for comfort purposes. Labs showed BNP of 165 and high-sensitivity troponin of 79. She was placed on heparin infusion  Review of Systems: All systems reviewed, and apart  from HPI, all negative  Past Medical History:  Diagnosis Date  . Breast cancer ([place])   . CAD (coronary artery disease)   . Diastolic CHF ([place])   . Hypertension   . Pacemaker   . Paroxysmal atrial fibrillation [facility])     Past Surgical History:  Procedure Laterality Date  . PACEMAKER IMPLANT       reports that she has never smoked. She has never used smokeless tobacco. She reports previous alcohol use. She reports previous drug use.  Allergies  Allergen Reactions  . Penicillins Hives  . Simvastatin Hives and Other (See Comments)  . Iron Itching and Other (See Comments)    Pt. States doesn't bother Pt. States doesn't bother    Family history Brother passed away with brain cancer  Prior to Admission medications   Medication Sig Start Date End Date Taking? Authorizing Provider  acetaminophen (TYLENOL) 650 MG CR tablet Take 650 mg by mouth every 8 (eight) hours as needed for pain. 08/18/17   [provider]  albuterol (PROVENTIL) (2.5 MG/3ML) 0.083% nebulizer solution Inhale 2.5 mg into the lungs every 4 (four) hours as needed. 05/20/11   [provider]  albuterol (VENTOLIN HFA) 108 (90 Base) MCG/ACT inhaler Inhale 2 puffs into the lungs every 6 (six) hours as needed for wheezing or shortness of breath.    [provider]  amLODipine (NORVASC) 10 MG tablet Take 10 mg by mouth daily.  05/28/20   [provider]  aspirin 81 MG EC tablet Take 81 mg by mouth daily.    [provider]  atorvastatin (LIPITOR) 80 MG tablet Take 80 mg by mouth daily. 05/27/20   [provider]  carvedilol (COREG) 25 MG tablet Take 25 mg by mouth 2 (two) times daily. 05/27/20   [provider]  LANTUS 100 UNIT/ML injection Inject 15 Units into the skin at bedtime. 02/19/20   [provider]  lisinopril (ZESTRIL) 20 MG tablet Take 20 mg by mouth daily. 05/28/20   [provider]  nitroGLYCERIN (NITROSTAT) 0.4 MG SL tablet Place 0.4 mg  under the tongue See admin instructions. Place 1 tablet (0.5 mg) under the tongue every five minutes as needed for chest pain. May take up to three doses.    [provider]  RYBELSUS 7 MG TABS Take 1 tablet by mouth daily. 06/23/20   [provider]    Physical Exam: Vitals:   11/30/20 1900 11/30/20 1915 11/30/20 1930 11/30/20 1945  BP: (!) 167/91 (!) 177/79 (!) 182/78 (!) 164/72  Pulse: 72 61 66 69  Resp: 18   18  Temp:      TempSrc:      SpO2: 98% 99% 99% 99%  Weight:      Height:        Constitutional: NAD, calm, comfortable Eyes: PERRL, lids and conjunctivae normal ENMT: Mucous membranes are moist.  Neck: normal, supple Respiratory: clear to auscultation bilaterally, no wheezing, no crackles. Normal respiratory effort. No accessory muscle use.  Cardiovascular: Regular rate and rhythm, 3/6 SEM. No extremity edema.  Abdomen: no tenderness, no masses palpated. Bowel sounds positive.  Musculoskeletal: no clubbing / cyanosis. Normal muscle tone.  Skin: no rashes, lesions, ulcers. No induration Neurologic: CN 2-12 grossly intact. Strength 5/5 in all 4.  Psychiatric: Normal judgment and insight. Alert and oriented x 3.  Labs on Admission: I have personally reviewed following labs and imaging studies  CBC: Recent Labs  Lab 11/30/20 1827  WBC 8.5  NEUTROABS 7.2  HGB 16.2*  HCT 48.1*  MCV 87.1  PLT 034   Basic Metabolic Panel: Recent Labs  Lab 11/30/20 1827  NA 139  K 4.2  CL 100  CO2 28  GLUCOSE 392*  BUN 14  CREATININE 0.87  CALCIUM 9.3  MG 2.8*   Liver Function Tests: Recent Labs  Lab 11/30/20 1827  AST 14*  ALT 12  ALKPHOS 62  BILITOT 0.5  PROT 7.4  ALBUMIN 4.0   Coagulation Profile: Recent Labs  Lab 11/30/20 1827  INR 1.0   BNP (last 3 results) No results for input(s): PROBNP in the last 8760 hours. CBG: No results for input(s): GLUCAP in the last 168 hours. Thyroid Function Tests: No results for input(s): TSH, T4TOTAL,  FREET4, T3FREE, THYROIDAB in the last 72 hours. Urine analysis:    Component Value Date/Time   COLORURINE YELLOW (A) 07/06/2020 0630   APPEARANCEUR CLEAR (A) 07/06/2020 0630   LABSPEC 1.014 07/06/2020 0630   PHURINE 6.0 07/06/2020 0630   GLUCOSEU >=500 (A) 07/06/2020 0630   HGBUR MODERATE (A) 07/06/2020 0630   BILIRUBINUR NEGATIVE 07/06/2020 0630   KETONESUR 5 (A) 07/06/2020 0630   PROTEINUR NEGATIVE 07/06/2020 0630   NITRITE NEGATIVE 07/06/2020 0630   LEUKOCYTESUR NEGATIVE 07/06/2020 0630     Radiological Exams on Admission: DG Chest 1 View  Result Date: 11/30/2020 CLINICAL DATA:  Shortness of breath. EXAM: CHEST  1 VIEW COMPARISON:  May 30, 2020. FINDINGS: The heart size and mediastinal contours are within normal limits. Both lungs are clear. No pneumothorax or pleural effusion is noted. Stable  position of right-sided pacemaker. The visualized skeletal structures are unremarkable. IMPRESSION: No active disease. Electronically Signed   By: Marijo Conception M.D.   On: 11/30/2020 19:11    EKG: Independently reviewed. Sinus rhythms, LBBB  Assessment/Plan  Principal Problem Acute onset shortness of breath -Possibly MI equivalent, patient was placed on heparin fusion due to elevation in troponin, continue to cycle. If troponins trend down Heparin can be discontinued -Check a D-dimer, if elevated will need a CT angiogram to rule out PE -Monitor on telemetry -Possibly related to hypertensive episode as well given history of noncompliance and history of hypertensive urgency  Active Problems Essential hypertension -Hypertensive in the ED but not to the degree that explain her shortness of breath. Resume home medications with amlodipine, Coreg, lisinopril  Elevated troponin, history of CAD -Cycle overnight, obtain 2D echo -Continue aspirin  Type 2 diabetes mellitus -Resume home Lantus, put on sliding scale  Hyperlipidemia -Continue statin  DVT prophylaxis: heparin  Code  Status: Full code  Family Communication: no family in the room Disposition Plan: home when ready  Bed Type: Telemetry  Consults called: cardiology, Dr Fletcher Anon  Obs/Inp: Observation   Marzetta Board, MD, PhD [facility]  Contact via [email or web address]  11/30/2020, 8:18 PM

## 2020-11-30 NOTE — Consult Note (Signed)
Cardiology Consultation:   Patient ID: Tara Sandoval MRN: [id number]; DOB: [birth year]  Admit date: 11/30/2020 Date of Consult: 11/30/2020  Primary Care Provider: System, Provider Not In [facility] Cardiologist: [place] Electrophysiologist:  None   Patient Profile:   Tara Sandoval is a 79 y.o. female with a hx of chronic diastolic heart failure who is being seen today for the evaluation of shortness of breath and abnormal EKG at the request of Dr. Joni Fears.  History of Present Illness:   Tara Sandoval is an 79 year old African-American female with known history of chronic diastolic heart failure due to hypertensive heart disease, status post permanent pacemaker placement with evidence of atrial fibrillation on device interrogation but the burden was not long enough to justify anticoagulation, history of stroke, seizures, and hyperlipidemia.  She also has history of breast cancer. She is followed at [facility] cardiology.  Most recent EKG there in July showed left bundle branch block. She presented to the ED with acute onset of shortness of breath with wheezing.  She was given Solu-Medrol and breathing treatments and subsequently improved.  An EKG was performed and there was concern about possible anterior ST elevation myocardial infarction and thus a code STEMI was activated.  At the time of my exam, she denied any chest pain and reported almost complete resolution of shortness of breath.  No orthopnea.  She has repeated presentations with hypertensive urgency.  She is currently hypertensive.  Past Medical History:  Diagnosis Date  . Breast cancer ([place])   . CAD (coronary artery disease)   . Diastolic CHF ([place])   . Hypertension   . Pacemaker   . Paroxysmal atrial fibrillation [facility])     Past Surgical History:  Procedure Laterality Date  . PACEMAKER IMPLANT       Home Medications:  Prior to Admission medications   Medication Sig Start Date End Date Taking? Authorizing Provider   acetaminophen (TYLENOL) 650 MG CR tablet Take 650 mg by mouth every 8 (eight) hours as needed for pain. 08/18/17   [provider]  albuterol (PROVENTIL) (2.5 MG/3ML) 0.083% nebulizer solution Inhale 2.5 mg into the lungs every 4 (four) hours as needed. 05/20/11   [provider]  albuterol (VENTOLIN HFA) 108 (90 Base) MCG/ACT inhaler Inhale 2 puffs into the lungs every 6 (six) hours as needed for wheezing or shortness of breath.    [provider]  amLODipine (NORVASC) 10 MG tablet Take 10 mg by mouth daily.  05/28/20   [provider]  aspirin 81 MG EC tablet Take 81 mg by mouth daily.    [provider]  atorvastatin (LIPITOR) 80 MG tablet Take 80 mg by mouth daily. 05/27/20   [provider]  carvedilol (COREG) 25 MG tablet Take 25 mg by mouth 2 (two) times daily. 05/27/20   [provider]  LANTUS 100 UNIT/ML injection Inject 15 Units into the skin at bedtime. 02/19/20   [provider]  lisinopril (ZESTRIL) 20 MG tablet Take 20 mg by mouth daily. 05/28/20   [provider]  nitroGLYCERIN (NITROSTAT) 0.4 MG SL tablet Place 0.4 mg under the tongue See admin instructions. Place 1 tablet (0.5 mg) under the tongue every five minutes as needed for chest pain. May take up to three doses.    [provider]  RYBELSUS 7 MG TABS Take 1 tablet by mouth daily. 06/23/20   [provider]    Inpatient Medications: Scheduled Meds:  Continuous Infusions: . heparin  PRN Meds:   Allergies:    Allergies  Allergen Reactions  . Penicillins Hives  . Simvastatin Hives and Other (See Comments)  . Iron Itching and Other (See Comments)    Pt. States doesn't bother Pt. States doesn't bother     Social History:   Social History   Socioeconomic History  . Marital status: Widowed    Spouse name: Not on file  . Number of children: Not on file  . Years of education: Not on file  . Highest education level: Not  on file  Occupational History  . Not on file  Tobacco Use  . Smoking status: Never Smoker  . Smokeless tobacco: Never Used  Substance and Sexual Activity  . Alcohol use: Not Currently  . Drug use: Not Currently  . Sexual activity: Not Currently  Other Topics Concern  . Not on file  Social History Narrative  . Not on file   Social Determinants of Health   Financial Resource Strain:   . Difficulty of Paying Living Expenses: Not on file  Food Insecurity:   . Worried About Charity fundraiser in the Last Year: Not on file  . Ran Out of Food in the Last Year: Not on file  Transportation Needs:   . Lack of Transportation (Medical): Not on file  . Lack of Transportation (Non-Medical): Not on file  Physical Activity:   . Days of Exercise per Week: Not on file  . Minutes of Exercise per Session: Not on file  Stress:   . Feeling of Stress : Not on file  Social Connections:   . Frequency of Communication with Friends and Family: Not on file  . Frequency of Social Gatherings with Friends and Family: Not on file  . Attends Religious Services: Not on file  . Active Member of Clubs or Organizations: Not on file  . Attends Archivist Meetings: Not on file  . Marital Status: Not on file  Intimate Partner Violence:   . Fear of Current or Ex-Partner: Not on file  . Emotionally Abused: Not on file  . Physically Abused: Not on file  . Sexually Abused: Not on file    Family History:   No family history of premature coronary artery disease.  ROS:  Please see the history of present illness.   All other ROS reviewed and negative.     Physical Exam/Data:   Vitals:   11/30/20 1800 11/30/20 1801 11/30/20 1900  BP: (!) 145/68  (!) 167/91  Pulse: 64  72  Resp: 18  18  Temp: 98.7 F (37.1 C)    TempSrc: Oral    SpO2: 99%  98%  Weight:  79.8 kg   Height:  4\' 11"  (1.499 m)    No intake or output data in the 24 hours ending 11/30/20 1910 Last 3 Weights 11/30/2020  Weight  (lbs) 176 lb  Weight (kg) 79.833 kg     Body mass index is 35.55 kg/m.  General:  Well nourished, well developed, in no acute distress HEENT: normal Lymph: no adenopathy Neck: no JVD Endocrine:  No thryomegaly Vascular: No carotid bruits; FA pulses 2+ bilaterally without bruits  Cardiac:  normal S1, S2; RRR; no murmur  Lungs: Diminished breath sounds bilaterally with few crackles at the base. Abd: soft, nontender, no hepatomegaly  Ext: no edema Musculoskeletal:  No deformities, BUE and BLE strength normal and equal Skin: warm and dry  Neuro:  CNs 2-12 intact, no focal abnormalities noted Psych:  Normal affect   EKG:  The EKG was personally reviewed and demonstrates: Normal sinus rhythm with IVCD resembling left bundle branch block, likely LVH with repolarization abnormalities. Telemetry:  Telemetry was personally reviewed and demonstrates: Sinus rhythm  Relevant CV Studies:   Laboratory Data:  High Sensitivity Troponin:   Recent Labs  Lab 11/30/20 1827  TROPONINIHS 79*     Chemistry Recent Labs  Lab 11/30/20 1827  NA 139  K 4.2  CL 100  CO2 28  GLUCOSE 392*  BUN 14  CREATININE 0.87  CALCIUM 9.3  GFRNONAA >60  ANIONGAP 11    Recent Labs  Lab 11/30/20 1827  PROT 7.4  ALBUMIN 4.0  AST 14*  ALT 12  ALKPHOS 62  BILITOT 0.5   Hematology Recent Labs  Lab 11/30/20 1827  WBC 8.5  RBC 5.52*  HGB 16.2*  HCT 48.1*  MCV 87.1  MCH 29.3  MCHC 33.7  RDW 13.0  PLT 238   BNP Recent Labs  Lab 11/30/20 1828  BNP 165.5*    DDimer No results for input(s): DDIMER in the last 168 hours.   Radiology/Studies:  No results found.   Assessment and Plan:   1. Acute shortness of breath: Reported wheezing that improved quickly with breathing treatment and Solu-Medrol.  No clear evidence of heart failure by exam.  Consider checking D-dimer.  Her x-ray does not show significant volume overload.  Continue to monitor closely for now. 2. Abnormal EKG with possible  unstable angina: Shortness of breath could be angina equivalent.  Initially a code STEMI was activated due to concerns about possible anterior STEMI.  However, she has an IVCD resolving left bundle branch block.  Left bundle branch block was reported in her previous EKG at [facility] in July and she does not meet STEMI criteria by [place] criteria.  Recommend serial cardiac enzymes.  Anticoagulation is recommended if troponin trends up. 3. Chronic diastolic heart failure: Recommend blood pressure control. 4. History of paroxysmal atrial fibrillation on pacemaker device interrogation.  The burden was not high enough to justify anticoagulation.  For questions or updates, please contact [place] Please consult [email or web address] for contact info under    Signed, Kathlyn Sacramento, MD  11/30/2020 7:10 PM

## 2020-11-30 NOTE — ED Triage Notes (Signed)
Pt comes into the ED via EMS from home with c/o sudden onset of SOB wheezing, 2L [place] at night at home only, EMS given 2 duonebs, 125mg  Solu medrol, 2G mag sulfate. #22g Rhand, pt is currently on 3L [place]

## 2020-11-30 NOTE — ED Notes (Signed)
Two warm blankets given to pt.

## 2020-11-30 NOTE — ED Provider Notes (Signed)
[facility] Emergency Department Provider Note  ____________________________________________  Time seen: Approximately 6:36 PM  I have reviewed the triage vital signs and the nursing notes.   HISTORY  Chief Complaint Shortness of Breath    HPI Tara Sandoval is a 79 y.o. female with a history of CHF hypertension paroxysmal atrial fibrillation and breast cancer who is 2 L nasal cannula oxygen at all times who reports sudden onset of  shortness of breath at about 4:30 PM today.  It feels different from her usual breathing issues.  Denies any chest pain dizziness or syncope.  No aggravating or alleviating factors.  Shortness of breath is constant.  She has been compliant with her medications but has not taken any today because she has been asleep all day which is unusual for her.  She has not eaten since yesterday.     Past Medical History:  Diagnosis Date  . Breast cancer ([place])   . CAD (coronary artery disease)   . Diastolic CHF ([place])   . Hypertension   . Pacemaker   . Paroxysmal atrial fibrillation [facility])      Patient Active Problem List   Diagnosis Date Noted  . Seizure ([place]) 07/06/2020  . Hypertensive emergency 07/06/2020  . Hyperglycemia due to type 2 diabetes mellitus ([place]) 07/06/2020  . History of CVA (cerebrovascular accident) 07/06/2020  . CAD (coronary artery disease) 07/06/2020  . AF (paroxysmal atrial fibrillation) ([place]) 07/06/2020  . Pacemaker 07/06/2020  . Hypertensive emergency without congestive heart failure 07/06/2020  . OSA (obstructive sleep apnea) 09/04/2015  . Breast cancer ([place]) 04/25/2009     Past Surgical History:  Procedure Laterality Date  . PACEMAKER IMPLANT       Prior to Admission medications   Medication Sig Start Date End Date Taking? Authorizing Provider  acetaminophen (TYLENOL) 650 MG CR tablet Take 650 mg by mouth every 8 (eight) hours as needed for pain. 08/18/17   [provider]  albuterol  (PROVENTIL) (2.5 MG/3ML) 0.083% nebulizer solution Inhale 2.5 mg into the lungs every 4 (four) hours as needed. 05/20/11   [provider]  albuterol (VENTOLIN HFA) 108 (90 Base) MCG/ACT inhaler Inhale 2 puffs into the lungs every 6 (six) hours as needed for wheezing or shortness of breath.    [provider]  amLODipine (NORVASC) 10 MG tablet Take 10 mg by mouth daily.  05/28/20   [provider]  aspirin 81 MG EC tablet Take 81 mg by mouth daily.    [provider]  atorvastatin (LIPITOR) 80 MG tablet Take 80 mg by mouth daily. 05/27/20   [provider]  carvedilol (COREG) 25 MG tablet Take 25 mg by mouth 2 (two) times daily. 05/27/20   [provider]  LANTUS 100 UNIT/ML injection Inject 15 Units into the skin at bedtime. 02/19/20   [provider]  lisinopril (ZESTRIL) 20 MG tablet Take 20 mg by mouth daily. 05/28/20   [provider]  nitroGLYCERIN (NITROSTAT) 0.4 MG SL tablet Place 0.4 mg under the tongue See admin instructions. Place 1 tablet (0.5 mg) under the tongue every five minutes as needed for chest pain. May take up to three doses.    [provider]  RYBELSUS 7 MG TABS Take 1 tablet by mouth daily. 06/23/20   [provider]     Allergies Penicillins, Simvastatin, and Iron   No family history on file.  Social History Social History   Tobacco Use  . Smoking status: Never  Smoker  . Smokeless tobacco: Never Used  Substance Use Topics  . Alcohol use: Not Currently  . Drug use: Not Currently    Review of Systems  Constitutional:   No fever or chills.  ENT:   No sore throat. No rhinorrhea. Cardiovascular:   No chest pain or syncope. Respiratory: Positive shortness of breath without cough. Gastrointestinal:   Negative for abdominal pain, vomiting and diarrhea.  Musculoskeletal:   Negative for focal pain or swelling All other systems reviewed and are negative except as documented above in ROS  and HPI.  ____________________________________________   PHYSICAL EXAM:  VITAL SIGNS: ED Triage Vitals  Enc Vitals Group     BP 11/30/20 1800 (!) 145/68     Pulse Rate 11/30/20 1800 64     Resp 11/30/20 1800 18     Temp 11/30/20 1800 98.7 F (37.1 C)     Temp Source 11/30/20 1800 Oral     SpO2 11/30/20 1800 99 %     Weight 11/30/20 1801 176 lb (79.8 kg)     Height 11/30/20 1801 4\' 11"  (1.499 m)     Head Circumference --      Peak Flow --      Pain Score 11/30/20 1826 0     Pain Loc --      Pain Edu? --      Excl. in [place]? --     Vital signs reviewed, nursing assessments reviewed.   Constitutional:   Alert and oriented. Non-toxic appearance. Eyes:   Conjunctivae are normal. EOMI. PERRL. ENT      Head:   Normocephalic and atraumatic.      Nose:   Wearing a mask.      Mouth/Throat:   Wearing a mask.      Neck:   No meningismus. Full ROM. Hematological/Lymphatic/Immunilogical:   No cervical lymphadenopathy. Cardiovascular:   RRR. Symmetric bilateral radial and DP pulses.  No murmurs. Cap refill less than 2 seconds. Respiratory:   Normal respiratory effort without tachypnea/retractions. Breath sounds are clear and equal bilaterally. No wheezes/rales/rhonchi. Gastrointestinal:   Soft and nontender. Non distended. There is no CVA tenderness.  No rebound, rigidity, or guarding. Musculoskeletal:   Normal range of motion in all extremities. No joint effusions.  No lower extremity tenderness.  No edema. Neurologic:   Normal speech and language.  Motor grossly intact. No acute focal neurologic deficits are appreciated.  Skin:    Skin is warm, dry and intact. No rash noted.  No petechiae, purpura, or bullae.  ____________________________________________    LABS (pertinent positives/negatives) (all labs ordered are listed, but only abnormal results are displayed) Labs Reviewed  CBC WITH DIFFERENTIAL/PLATELET - Abnormal; Notable for the following components:      Result Value    RBC 5.52 (*)    Hemoglobin 16.2 (*)    HCT 48.1 (*)    All other components within normal limits  COMPREHENSIVE METABOLIC PANEL - Abnormal; Notable for the following components:   Glucose, Bld 392 (*)    AST 14 (*)    All other components within normal limits  MAGNESIUM - Abnormal; Notable for the following components:   Magnesium 2.8 (*)    All other components within normal limits  BRAIN NATRIURETIC PEPTIDE - Abnormal; Notable for the following components:   B Natriuretic Peptide 165.5 (*)    All other components within normal limits  TROPONIN I (HIGH SENSITIVITY) - Abnormal; Notable for the following components:   Troponin I (High Sensitivity) 79 (*)  All other components within normal limits  RESP PANEL BY RT-PCR (FLU A&B, COVID) ARPGX2  PROTIME-INR  APTT  HEPARIN LEVEL (UNFRACTIONATED)  FIBRIN DERIVATIVES D-DIMER ([facility] ONLY)   ____________________________________________   EKG  Interpreted by me Normal sinus rhythm rate of 69, rightward axis.  Slightly prolonged QTC at 507 ms.  Borderline left bundle branch block.  ST elevation in V1 and V2 with ST depression in II III and aVF concerning for STEMI  ____________________________________________    RADIOLOGY  DG Chest 1 View  Result Date: 11/30/2020 CLINICAL DATA:  Shortness of breath. EXAM: CHEST  1 VIEW COMPARISON:  May 30, 2020. FINDINGS: The heart size and mediastinal contours are within normal limits. Both lungs are clear. No pneumothorax or pleural effusion is noted. Stable position of right-sided pacemaker. The visualized skeletal structures are unremarkable. IMPRESSION: No active disease. Electronically Signed   By: Marijo Conception M.D.   On: 11/30/2020 19:11    ____________________________________________   PROCEDURES .Critical Care Performed by: Carrie Mew, MD Authorized by: Carrie Mew, MD   Critical care provider statement:    Critical care time (minutes):  33   Critical care time was  exclusive of:  Separately billable procedures and treating other patients   Critical care was necessary to treat or prevent imminent or life-threatening deterioration of the following conditions:  Cardiac failure   Critical care was time spent personally by me on the following activities:  Development of treatment plan with patient or surrogate, discussions with consultants, evaluation of patient's response to treatment, examination of patient, obtaining history from patient or surrogate, ordering and performing treatments and interventions, ordering and review of laboratory studies, ordering and review of radiographic studies, pulse oximetry, re-evaluation of patient's condition and review of old charts    ____________________________________________  DIFFERENTIAL DIAGNOSIS   STEMI, non-STEMI, pulmonary embolism, pneumonia, pleural effusion, pulmonary edema, COPD exacerbation  CLINICAL IMPRESSION / ASSESSMENT AND PLAN / ED COURSE  Medications ordered in the ED: Medications  heparin ADULT infusion 100 units/mL (25000 units/262mL sodium chloride 0.45%) (750 Units/hr Intravenous New Bag/Given 11/30/20 1918)  aspirin chewable tablet 324 mg (324 mg Oral Given 11/30/20 1844)  heparin injection 4,000 Units (4,000 Units Intravenous Given 11/30/20 1842)    Pertinent labs & imaging results that were available during my care of the patient were reviewed by me and considered in my medical decision making (see chart for details).  Tara Sandoval was evaluated in Emergency Department on 11/30/2020 for the symptoms described in the history of present illness. She was evaluated in the context of the global COVID-19 pandemic, which necessitated consideration that the patient might be at risk for infection with the SARS-CoV-2 virus that causes COVID-19. Institutional protocols and algorithms that pertain to the evaluation of patients at risk for COVID-19 are in a state of rapid change based on information released  by regulatory bodies including the CDC and federal and state organizations. These policies and algorithms were followed during the patient's care in the ED.   Patient presents with sudden onset of shortness of breath, EKG concerning for acute ischemia.  Code STEMI initiated and patient brought to treatment room.  Will give aspirin and heparin bolus for now, awaiting cardiology evaluation.  Will keep patient n.p.o.  We will send labs and Covid screening  Clinical Course as of Nov 30 1954  Mon Nov 30, 2020  1844 Case d/w Dr. Fletcher Anon, who is en route.    [PS]  1905 D/w Dr. Fletcher Anon. Sx have  resolved, ekg is similar to one he found in [facility] system. He doubts STEMI, would workup medically.  Initial trop is 79, elevated compared to prior baseline of about 40. Will plan to admit for further evaluation.    [PS]    Clinical Course User Index [PS] Carrie Mew, MD     ____________________________________________   FINAL CLINICAL IMPRESSION(S) / ED DIAGNOSES    Final diagnoses:  NSTEMI (non-ST elevated myocardial infarction) ([place])  SOB (shortness of breath)  Chronic respiratory failure with hypoxia [facility])     ED Discharge Orders    None      Portions of this note were generated with dragon dictation software. Dictation errors may occur despite best attempts at proofreading.   Carrie Mew, MD 11/30/20 1956

## 2020-11-30 NOTE — ED Notes (Signed)
Called Phil in carelink to initiate stemi 1827

## 2020-11-30 NOTE — ED Triage Notes (Signed)
Pt here with SOB and wheezing. Pt wears 2L at home. Pt NAD in triage.

## 2020-11-30 NOTE — ED Notes (Signed)
Assisted pt on to bedpan

## 2020-11-30 NOTE — ED Notes (Addendum)
Date and time results received: 11/30/20 2123 (use smartphrase ".now" to insert current time)  Test: Troponin Critical Value: 111  Name of Provider Notified: Marzetta Board, MD  Orders Received? Or Actions Taken?: Orders Received - See Orders for details

## 2020-11-30 NOTE — Progress Notes (Addendum)
[place] for Heparin Infusion   Indication: ACS/STEMI  Allergies  Allergen Reactions  . Penicillins Hives  . Simvastatin Hives and Other (See Comments)  . Iron Itching and Other (See Comments)    Pt. States doesn't bother Pt. States doesn't bother     Patient Measurements: Height: 4\' 11"  (149.9 cm) Weight: 79.8 kg (176 lb) IBW/kg (Calculated) : 43.2 Heparin Dosing Weight: 61.7 kg  Vital Signs: Temp: 98.7 F (37.1 C) (12/06 1800) Temp Source: Oral (12/06 1800) BP: 145/68 (12/06 1800) Pulse Rate: 64 (12/06 1800)  Labs: Recent Labs    11/30/20 1827  HGB 16.2*  HCT 48.1*  PLT 238  LABPROT 13.1  INR 1.0  CREATININE 0.87  TROPONINIHS 79*    Estimated Creatinine Clearance: 47.1 mL/min (by C-G formula based on SCr of 0.87 mg/dL).   Medical History: Past Medical History:  Diagnosis Date  . Breast cancer ([place])   . CAD (coronary artery disease)   . Diastolic CHF ([place])   . Hypertension   . Pacemaker   . Paroxysmal atrial fibrillation ([facility])     Assessment: Pt given 4000 units heparin in ER.  Goal of Therapy:  Heparin level 0.3-0.7 units/ml Monitor platelets by anticoagulation protocol: Yes   Plan:  Ordered baseline aPTT level Start heparin infusion at 750 units/hr Check anti-Xa level in 8 hours and daily while on heparin Continue to monitor H&H and platelets   Renda Rolls, PharmD, [facility] 11/30/2020 7:06 PM.

## 2020-12-01 ENCOUNTER — Observation Stay (HOSPITAL_BASED_OUTPATIENT_CLINIC_OR_DEPARTMENT_OTHER)
Admit: 2020-12-01 | Discharge: 2020-12-01 | Disposition: A | Discharge status: Home / Self Care | Payer: Medicare Other | Attending: Internal Medicine | Admitting: Internal Medicine

## 2020-12-01 ENCOUNTER — Observation Stay: Payer: Medicare Other

## 2020-12-01 ENCOUNTER — Other Ambulatory Visit: Payer: Self-pay

## 2020-12-01 DIAGNOSIS — R9431 Abnormal electrocardiogram [ECG] [EKG]: Secondary | ICD-10-CM | POA: Diagnosis not present

## 2020-12-01 DIAGNOSIS — R0602 Shortness of breath: Secondary | ICD-10-CM | POA: Diagnosis not present

## 2020-12-01 LAB — CBC
HCT: 43.9 % (ref 36.0–46.0)
Hemoglobin: 14.9 g/dL (ref 12.0–15.0)
MCH: 29.2 pg (ref 26.0–34.0)
MCHC: 33.9 g/dL (ref 30.0–36.0)
MCV: 86.1 fL (ref 80.0–100.0)
Platelets: 224 10*3/uL (ref 150–400)
RBC: 5.1 MIL/uL (ref 3.87–5.11)
RDW: 13.1 % (ref 11.5–15.5)
WBC: 9.8 10*3/uL (ref 4.0–10.5)
nRBC: 0 % (ref 0.0–0.2)

## 2020-12-01 LAB — ECHOCARDIOGRAM COMPLETE
AR max vel: 2.17 cm2
AV Area VTI: 2.45 cm2
AV Area mean vel: 2.13 cm2
AV Mean grad: 4 mmHg
AV Peak grad: 7.1 mmHg
Ao pk vel: 1.34 m/s
Area-P 1/2: 2.14 cm2
Height: 64 in
S' Lateral: 2.08 cm
Weight: 2558.4 oz

## 2020-12-01 LAB — HEPARIN LEVEL (UNFRACTIONATED): Heparin Unfractionated: 0.25 IU/mL — ABNORMAL LOW (ref 0.30–0.70)

## 2020-12-01 LAB — GLUCOSE, CAPILLARY
Glucose-Capillary: 310 mg/dL — ABNORMAL HIGH (ref 70–99)
Glucose-Capillary: 330 mg/dL — ABNORMAL HIGH (ref 70–99)
Glucose-Capillary: 292 mg/dL — ABNORMAL HIGH (ref 70–99)

## 2020-12-01 LAB — TROPONIN I (HIGH SENSITIVITY)
Troponin I (High Sensitivity): 58 ng/L — ABNORMAL HIGH (ref <18)
Troponin I (High Sensitivity): 96 ng/L — ABNORMAL HIGH (ref <18)

## 2020-12-01 LAB — CREATININE, SERUM
Creatinine, Ser: 0.86 mg/dL (ref 0.44–1.00)
GFR, Estimated: >60 mL/min (ref >60)

## 2020-12-01 MED ORDER — LISINOPRIL 20 MG PO TABS
40.0000 mg | ORAL_TABLET | Freq: Every day | ORAL | Status: DC
  Administered 2020-12-01: 40 mg via ORAL
  Filled 2020-12-01: qty 2

## 2020-12-01 MED ORDER — AMLODIPINE BESYLATE 10 MG PO TABS
10.0000 mg | ORAL_TABLET | Freq: Every day | ORAL | 1 refills | Status: DC
Start: 2020-12-02 — End: 2023-06-28

## 2020-12-01 MED ORDER — IOHEXOL 350 MG/ML SOLN
75.0000 mL | Freq: Once | INTRAVENOUS | Status: AC | PRN
  Administered 2020-12-01: 75 mL via INTRAVENOUS

## 2020-12-01 MED ORDER — HEPARIN SODIUM (PORCINE) 5000 UNIT/ML IJ SOLN: 5000.0000 [IU] | Freq: Three times a day (TID) | INTRAMUSCULAR | Status: DC

## 2020-12-01 NOTE — Progress Notes (Addendum)
Progress Note  Patient Name: Tara Sandoval Date of Encounter: 12/01/2020  Primary Cardiologist: [place] "great" this morning. No chest pain, dyspnea, or palpitations. HS-Tn peaked at 111 and is down trending.   Inpatient Medications    Scheduled Meds: . amLODipine  10 mg Oral Daily  . aspirin EC  81 mg Oral Daily  . atorvastatin  80 mg Oral Daily  . carvedilol  25 mg Oral BID  . insulin aspart  0-5 Units Subcutaneous QHS  . insulin aspart  0-9 Units Subcutaneous TID WC  . insulin glargine  10 Units Subcutaneous QHS  . lisinopril  20 mg Oral Daily   Continuous Infusions: . heparin 950 Units/hr (12/01/20 0507)   PRN Meds: acetaminophen **OR** acetaminophen, albuterol, ondansetron **OR** ondansetron (ZOFRAN) IV   Vital Signs    Vitals:   12/01/20 0600 12/01/20 0700 12/01/20 0818 12/01/20 0820  BP: (!) 162/93 (!) 159/92 (!) 142/83   Pulse: 79 60 65   Resp: 18 16 18    Temp:  (!) 97.5 F (36.4 C)    TempSrc:      SpO2: 97% 97% 93%   Weight:    72.5 kg  Height:    5\' 4"  (1.626 m)   No intake or output data in the 24 hours ending 12/01/20 0831 Filed Weights   11/30/20 1801 12/01/20 0820  Weight: 79.8 kg 72.5 kg    Telemetry    Atrial paced rhtyhm - Personally Reviewed  ECG    No new tracings - Personally Reviewed  Physical Exam   GEN: No acute distress.   Neck: No JVD. Cardiac: RRR, no murmurs, rubs, or gallops.  Respiratory: Clear to auscultation bilaterally.  GI: Soft, nontender, non-distended.   MS: No edema; No deformity. Neuro:  Alert and oriented x 3; Nonfocal.  Psych: Normal affect.  Labs    Chemistry Recent Labs  Lab 11/30/20 1827  NA 139  K 4.2  CL 100  CO2 28  GLUCOSE 392*  BUN 14  CREATININE 0.87  CALCIUM 9.3  PROT 7.4  ALBUMIN 4.0  AST 14*  ALT 12  ALKPHOS 62  BILITOT 0.5  GFRNONAA >60  ANIONGAP 11     Hematology Recent Labs  Lab 11/30/20 1827  WBC 8.5  RBC 5.52*  HGB 16.2*  HCT 48.1*  MCV 87.1   MCH 29.3  MCHC 33.7  RDW 13.0  PLT 238    Cardiac EnzymesNo results for input(s): TROPONINI in the last 168 hours. No results for input(s): TROPIPOC in the last 168 hours.   BNP Recent Labs  Lab 11/30/20 1828  BNP 165.5*     DDimer No results for input(s): DDIMER in the last 168 hours.   Radiology    DG Chest 1 View  Result Date: 11/30/2020 IMPRESSION: No active disease. Electronically Signed   By: Marijo Conception M.D.   On: 11/30/2020 19:11   CT ANGIO CHEST PE W OR WO CONTRAST  Result Date: 12/01/2020 IMPRESSION: No central, segmental, or subsegmental pulmonary embolism. No other acute intrathoracic pathology to explain the patient's symptoms. Mild cardiomegaly Aortic Atherosclerosis (ICD10-I70.0). Electronically Signed   By: Prudencio Pair M.D.   On: 12/01/2020 00:58    Cardiac Studies   2D echo pending  Patient Profile     79 y.o. female with history of CAD, HFpEF, hypertensive heart disease, sinus node dysfunction s/p MDT PPM, Afib noted on device interrogation in 06/2017, breast cancer, CVA, DM2, HTN,  and HLD who we are seeing for elevated troponin.  Assessment & Plan    1. Dyspnea: -Uncertain etiology -Resolved with breathing treatments -Feels back to her basline -CTA chest negative for PE -Echo pending  2. Abnormal EKG/elevated HS-Tn/CAD: -Initially, code STEMI was called due to concerns for possible anterior STEMI. 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  HFpEF: -She does not appear grossly volume up -Optimal BP control recommended -Titrate lisinopril to 40 mg daily -Continue Coreg and amlodipine -If needed can place her back on hydralazine -Echo pending  4. Hypertensive heart disease: -Blood pressure has ranged from the 295A to 213Y systolic this admission with current readings in the 865H sysotlic  5. PAF: -Maintaining sinus rhythm -Note don device interrogation in 06/2017, though burden was not great enough for initiation of [place] -Follow up with [facility] cardiology as directed  For questions or updates, please contact [place] Please consult [email or web address] for contact info under Cardiology/STEMI.    Signed, Christell Faith, PA-C [place] Pager: [phone] 12/01/2020, 8:31 AM

## 2020-12-01 NOTE — Progress Notes (Signed)
*  PRELIMINARY RESULTS* Echocardiogram 2D Echocardiogram has been performed.  Tara Sandoval 12/01/2020, 12:05 PM

## 2020-12-01 NOTE — ED Notes (Signed)
Pt ambulatory with assistance to toilet in the room

## 2020-12-01 NOTE — Progress Notes (Addendum)
Inpatient Diabetes Program Recommendations  AACE/ADA: New Consensus Statement on Inpatient Glycemic Control (2015)  Target Ranges:  Prepandial:   less than 140 mg/dL      Peak postprandial:   less than 180 mg/dL (1-2 hours)      Critically ill patients:  140 - 180 mg/dL   Lab Results  Component Value Date   GLUCAP 330 (H) 12/01/2020   HGBA1C 11.7 (H) 11/30/2020    Review of Glycemic Control Results for Tara Sandoval, Tara Sandoval (MRN [id number]) as of 12/01/2020 11:42  Ref. Range 07/07/2020 09:25 07/07/2020 13:04 11/30/2020 21:48 12/01/2020 09:13 12/01/2020 11:27  Glucose-Capillary Latest Ref Range: 70 - 99 mg/dL 255 (H) 291 (H) 378 (H) 310 (H) 330 (H)   Diabetes history:  DM 2 Outpatient Diabetes medications:  Basaglar 28 units bid Novolog 12-15 units tid with meals Victoza 0.6 mg daily Current orders for Inpatient glycemic control:  Lantus 10 units q HS Novolog sensitive tid with meals and HS Inpatient Diabetes Program Recommendations:    Please increase Lantus to 20 units bid.  Also please add Novolog meal coverage 6 units tid with meals (hold if patient eats less than 50% or NPO).  A1C indicates uncontrolled DM- Will attempt to discuss with patient.   Thanks  Adah Perl, RN, BC-ADM Inpatient Diabetes Coordinator Pager [phone] (8a-5p)  Addendum: spoke with patient regarding home diabetes control by phone.  Of note, A1C is a little better (it was 12.5% in July of 2021).  Patient states she lives with her daughter and she reminds her to eat and take her medications.  She said that her blood sugars are doing a little better.  I discussed her A1C results and told her that it is still >goal.  I discussed that her goal blood sugars should be around 150 mg/dL.  She states that she has PCP.  Encouraged her to f/u with PCP and continue to monitor blood sugars closely.  Patient was appreciative of call.

## 2020-12-01 NOTE — Discharge Summary (Signed)
Physician Discharge Summary  Tara Sandoval IRC:[id number] DOB: 03-Nov-1941 DOA: 11/30/2020  PCP: System, Provider Not In  Admit date: 11/30/2020 Discharge date: 12/01/2020  Admitted From: Home Disposition: Home  Recommendations for Outpatient Follow-up:  1. Follow up with PCP in 1-2 weeks 2. Please obtain BMP/CBC in one week 3. Please follow up on the following pending results: None  Home Health: No Equipment/Devices: None Discharge Condition: Stable CODE STATUS: Full Diet recommendation: Heart Healthy / Carb Modified   Brief/Interim Summary: Tara Sandoval is a 79 y.o. female with medical history significant of breast cancer, CAD, chronic diastolic CHF, hypertension, paroxysmal A. fib/pacemaker (per cardiology A. fib burden not long enough to justify anticoagulation), prior CVA, prior hospitalization earlier this year with hypertensive emergency, seizure disorder, comes to the hospital with complaints of shortness of breath. Patient was in her normal state of health this afternoon, was cooking dinner and all of a sudden experienced sudden onset shortness of breath with wheezing. She denies any chest pain or tightness in her chest. Denies any nausea, no vomiting, no abdominal pain. No fever or chills.  initial EKG was concerning for left bundle branch block, possible STEMI and cardiology was consulted, evaluated patient, and STEMI code was canceled as the left bundle branch block was reported in her previous EKG at [facility] in July and she does not meet STEMI criteria by [facility]. She was found to have elevated blood pressure in the 160s, she is afebrile and satting well on room air but was put on 3 L for comfort purposes. Labs showed BNP of 165 and high-sensitivity troponin of 79. She was placed on heparin infusion, which was discontinued by cardiology next morning as troponin trending down.  Echocardiogram with hyperdynamic EF and grade 1 diastolic dysfunction. Patient already had CTA done for  positive D-dimer which was negative for PE and no other acute abnormality.  Her home dose of amlodipine was increased and she will continue with rest of her medications and follow-up with her providers as an outpatient.  Discharge Diagnoses:  Active Problems:   SOB (shortness of breath)   Discharge Instructions  Discharge Instructions    Diet - low sodium heart healthy   Complete by: As directed    Discharge instructions   Complete by: As directed    It was pleasure taking care of you. Please take your medications regularly, if you forget take it as soon as you remember. We also increase the dose of amlodipine from 5 mg to 10 mg daily. Follow-up with your cardiologist as an outpatient.   Increase activity slowly   Complete by: As directed      Allergies as of 12/01/2020      Reactions   Penicillins Hives   Simvastatin Hives, Other (See Comments)   Iron Itching, Other (See Comments)   Pt. States doesn't bother Pt. States doesn't bother      Medication List    TAKE these medications   acetaminophen 650 MG CR tablet Commonly known as: TYLENOL Take 650 mg by mouth every 8 (eight) hours as needed for pain.   albuterol 108 (90 Base) MCG/ACT inhaler Commonly known as: VENTOLIN HFA Inhale 2 puffs into the lungs every 6 (six) hours as needed for wheezing or shortness of breath.   albuterol (2.5 MG/3ML) 0.083% nebulizer solution Commonly known as: PROVENTIL Inhale 2.5 mg into the lungs every 4 (four) hours as needed.   amLODipine 10 MG tablet Commonly known as: NORVASC Take 1 tablet (10 mg total)  by mouth daily. Start taking on: December 02, 2020 What changed:   medication strength  how much to take   aspirin 81 MG EC tablet Take 81 mg by mouth daily.   atorvastatin 80 MG tablet Commonly known as: LIPITOR Take 80 mg by mouth daily.   Basaglar KwikPen 100 UNIT/ML Inject 28 Units into the skin 2 (two) times daily. What changed: Another medication with the same  name was removed. Continue taking this medication, and follow the directions you see here.   carvedilol 25 MG tablet Commonly known as: COREG Take 25 mg by mouth 2 (two) times daily.   hydrALAZINE 25 MG tablet Commonly known as: APRESOLINE Take 25 mg by mouth in the morning and at bedtime.   liraglutide 18 MG/3ML Sopn Commonly known as: VICTOZA Inject 0.6 mg into the skin as needed. If BS over 600   lisinopril 20 MG tablet Commonly known as: ZESTRIL Take 20 mg by mouth daily.   nitroGLYCERIN 0.4 MG SL tablet Commonly known as: NITROSTAT Place 0.4 mg under the tongue See admin instructions. Place 1 tablet (0.5 mg) under the tongue every five minutes as needed for chest pain. May take up to three doses.   NovoLOG 100 UNIT/ML injection Generic drug: insulin aspart Inject 12-15 Units into the skin 3 (three) times daily with meals.   Rybelsus 7 MG Tabs Generic drug: Semaglutide Take 1 tablet by mouth daily.       Follow-up Information    End, Harrell Gave, MD. Schedule an appointment as soon as possible for a visit.   Specialty: Cardiology Contact information: [place] [address] [phone]              Allergies  Allergen Reactions  . Penicillins Hives  . Simvastatin Hives and Other (See Comments)  . Iron Itching and Other (See Comments)    Pt. States doesn't bother Pt. States doesn't bother     Consultations:  Cardiology  Procedures/Studies: DG Chest 1 View  Result Date: 11/30/2020 CLINICAL DATA:  Shortness of breath. EXAM: CHEST  1 VIEW COMPARISON:  May 30, 2020. FINDINGS: The heart size and mediastinal contours are within normal limits. Both lungs are clear. No pneumothorax or pleural effusion is noted. Stable position of right-sided pacemaker. The visualized skeletal structures are unremarkable. IMPRESSION: No active disease. Electronically Signed   By: Marijo Conception M.D.   On: 11/30/2020 19:11   CT ANGIO CHEST PE W OR  WO CONTRAST  Result Date: 12/01/2020 CLINICAL DATA:  Chest pain EXAM: CT ANGIOGRAPHY CHEST WITH CONTRAST TECHNIQUE: Multidetector CT imaging of the chest was performed using the standard protocol during bolus administration of intravenous contrast. Multiplanar CT image reconstructions and MIPs were obtained to evaluate the vascular anatomy. CONTRAST:  32mL OMNIPAQUE IOHEXOL 350 MG/ML SOLN COMPARISON:  None. FINDINGS: Cardiovascular: There is a optimal opacification of the pulmonary arteries. There is no central,segmental, or subsegmental filling defects within the pulmonary arteries. There is mild cardiomegaly present. No pericardial effusion or thickening. No evidence right heart strain. There is normal three-vessel brachiocephalic anatomy without proximal stenosis. Scattered aortic atherosclerosis is noted. There is coronary artery calcifications present. Again noted is a right-sided pacemaker with lead tips in the right ventricle. Mediastinum/Nodes: No hilar, mediastinal, or axillary adenopathy. Thyroid gland, trachea, and esophagus demonstrate no significant findings. Lungs/Pleura: The lungs are clear. No pleural effusion or pneumothorax. No airspace consolidation. Upper Abdomen: No acute abnormalities present in the visualized portions of the upper abdomen.  Musculoskeletal: No chest wall abnormality. No acute or significant osseous findings. Review of the MIP images confirms the above findings. IMPRESSION: No central, segmental, or subsegmental pulmonary embolism. No other acute intrathoracic pathology to explain the patient's symptoms. Mild cardiomegaly Aortic Atherosclerosis (ICD10-I70.0). Electronically Signed   By: Prudencio Pair M.D.   On: 12/01/2020 00:58   ECHOCARDIOGRAM COMPLETE  Result Date: 12/01/2020    ECHOCARDIOGRAM REPORT   Patient Name:   Tara Sandoval Date of Exam: 12/01/2020 Medical Rec #:  [id number]    Height:       64.0 in Accession #:    [id number]   Weight:       159.9 lb Date of Birth:   [birth year]    BSA:          1.779 m Patient Age:    19 years     BP:           159/92 mmHg Patient Gender: F            HR:           60 bpm. Exam Location:  [facility] Procedure: 2D Echo, Cardiac Doppler, Color Doppler and Strain Analysis Indications:     Abnormal ECG 794.31  History:         Patient has no prior history of Echocardiogram examinations.                  Pacemaker; Risk Factors:Hypertension.  Sonographer:     Sherrie Sport RDCS (AE) Referring Phys:  [place] Diagnosing Phys: Nelva Bush MD  Sonographer Comments: Global longitudinal strain was attempted. IMPRESSIONS  1. Left ventricular ejection fraction, by estimation, is 70 to 75%. The left ventricle has hyperdynamic function. The left ventricle has no regional wall motion abnormalities. There is moderate left ventricular hypertrophy. Left ventricular diastolic parameters are consistent with Grade I diastolic dysfunction (impaired relaxation). Elevated left atrial pressure. The average left ventricular global longitudinal strain is -13.2 %. The global longitudinal strain is abnormal.  2. Right ventricular systolic function is normal. The right ventricular size is normal. There is normal pulmonary artery systolic pressure.  3. The mitral valve is normal in structure. Trivial mitral valve regurgitation.  4. The aortic valve has an indeterminant number of cusps. There is mild calcification of the aortic valve. There is moderate thickening of the aortic valve. Aortic valve regurgitation is not visualized. Mild to moderate aortic valve sclerosis/calcification is present, without any evidence of aortic stenosis.  5. Pulmonic valve regurgitation not well assessed.  6. The inferior vena cava is normal in size with greater than 50% respiratory variability, suggesting right atrial pressure of 3 mmHg. FINDINGS  Left Ventricle: Left ventricular ejection fraction, by estimation, is 70 to 75%. The left ventricle has hyperdynamic function. The left  ventricle has no regional wall motion abnormalities. The average left ventricular global longitudinal strain is -13.2  %. The global longitudinal strain is abnormal. The left ventricular internal cavity size was normal in size. There is moderate left ventricular hypertrophy. Left ventricular diastolic parameters are consistent with Grade I diastolic dysfunction (impaired relaxation). Elevated left atrial pressure. Right Ventricle: The right ventricular size is normal. No increase in right ventricular wall thickness. Right ventricular systolic function is normal. There is normal pulmonary artery systolic pressure. The tricuspid regurgitant velocity is 2.59 m/s, and  with an assumed right atrial pressure of 3 mmHg, the estimated right ventricular systolic pressure is 32.4 mmHg. Left Atrium: Left atrial size was normal  in size. Right Atrium: Right atrial size was normal in size. Pericardium: There is no evidence of pericardial effusion. Mitral Valve: The mitral valve is normal in structure. Trivial mitral valve regurgitation. Tricuspid Valve: The tricuspid valve is not well visualized. Tricuspid valve regurgitation is trivial. Aortic Valve: The aortic valve has an indeterminant number of cusps. There is mild calcification of the aortic valve. There is moderate thickening of the aortic valve. Aortic valve regurgitation is not visualized. Mild to moderate aortic valve sclerosis/calcification is present, without any evidence of aortic stenosis. Aortic valve mean gradient measures 4.0 mmHg. Aortic valve peak gradient measures 7.1 mmHg. Aortic valve area, by VTI measures 2.45 cm. Pulmonic Valve: The pulmonic valve was not well visualized. Pulmonic valve regurgitation not well assessed. No evidence of pulmonic stenosis. Aorta: The aortic root is normal in size and structure. Pulmonary Artery: The pulmonary artery is not well seen. Venous: The inferior vena cava is normal in size with greater than 50% respiratory  variability, suggesting right atrial pressure of 3 mmHg. IAS/Shunts: No atrial level shunt detected by color flow Doppler. Additional Comments: A pacer wire is visualized in the right ventricle.  LEFT VENTRICLE PLAX 2D LVIDd:         3.49 cm  Diastology LVIDs:         2.08 cm  LV e' medial:    3.70 cm/s LV PW:         1.26 cm  LV E/e' medial:  15.7 LV IVS:        1.43 cm  LV e' lateral:   5.87 cm/s LVOT diam:     2.00 cm  LV E/e' lateral: 9.9 LV SV:         71 LV SV Index:   40       2D Longitudinal Strain LVOT Area:     3.14 cm 2D Strain GLS Avg:     -13.2 %  RIGHT VENTRICLE RV Basal diam:  2.59 cm RV S prime:     14.10 cm/s TAPSE (M-mode): 2.8 cm LEFT ATRIUM             Index       RIGHT ATRIUM           Index LA diam:        3.40 cm 1.91 cm/m  RA Area:     14.60 cm LA Vol (A2C):   49.5 ml 27.83 ml/m RA Volume:   34.00 ml  19.11 ml/m LA Vol (A4C):   27.6 ml 15.52 ml/m LA Biplane Vol: 38.9 ml 21.87 ml/m  AORTIC VALVE                   PULMONIC VALVE AV Area (Vmax):    2.17 cm    PV Vmax:        0.93 m/s AV Area (Vmean):   2.13 cm    PV Peak grad:   3.4 mmHg AV Area (VTI):     2.45 cm    RVOT Peak grad: 4 mmHg AV Vmax:           133.50 cm/s AV Vmean:          95.350 cm/s AV VTI:            0.292 m AV Peak Grad:      7.1 mmHg AV Mean Grad:      4.0 mmHg LVOT Vmax:         92.40 cm/s LVOT Vmean:  64.700 cm/s LVOT VTI:          0.227 m LVOT/AV VTI ratio: 0.78  AORTA Ao Root diam: 3.00 cm MITRAL VALVE                TRICUSPID VALVE MV Area (PHT): 2.14 cm     TR Peak grad:   26.8 mmHg MV Decel Time: 354 msec     TR Vmax:        259.00 cm/s MV E velocity: 58.20 cm/s MV A velocity: 112.00 cm/s  SHUNTS MV E/A ratio:  0.52         Systemic VTI:  0.23 m                             Systemic Diam: 2.00 cm Nelva Bush MD Electronically signed by Nelva Bush MD Signature Date/Time: 12/01/2020/4:19:39 PM    Final      Subjective: Patient was feeling better when seen today.  Denies any chest pain or  shortness of breath.  Feels at baseline.  Wants to go home.  Discharge Exam: Vitals:   12/01/20 0818 12/01/20 1128  BP: (!) 142/83 135/78  Pulse: 65 77  Resp: 18 18  Temp:  97.7 F (36.5 C)  SpO2: 93% 98%   Vitals:   12/01/20 0700 12/01/20 0818 12/01/20 0820 12/01/20 1128  BP: (!) 159/92 (!) 142/83  135/78  Pulse: 60 65  77  Resp: 16 18  18   Temp: (!) 97.5 F (36.4 C)   97.7 F (36.5 C)  TempSrc:      SpO2: 97% 93%  98%  Weight:   72.5 kg   Height:   5\' 4"  (1.626 m)     General: Pt is alert, awake, not in acute distress Cardiovascular: RRR, S1/S2 +, no rubs, no gallops Respiratory: CTA bilaterally, no wheezing, no rhonchi Abdominal: Soft, NT, ND, bowel sounds + Extremities: no edema, no cyanosis   The results of significant diagnostics from this hospitalization (including imaging, microbiology, ancillary and laboratory) are listed below for reference.    Microbiology: Recent Results (from the past 240 hour(s))  Resp Panel by RT-PCR (Flu A&B, Covid) Nasopharyngeal Swab     Status: None   Collection Time: 11/30/20  6:35 PM   Specimen: Nasopharyngeal Swab; Nasopharyngeal(NP) swabs in vial transport medium  Result Value Ref Range Status   SARS Coronavirus 2 by RT PCR NEGATIVE NEGATIVE Final    Comment: (NOTE) SARS-CoV-2 target nucleic acids are NOT DETECTED.  The SARS-CoV-2 RNA is generally detectable in upper respiratory specimens during the acute phase of infection. The lowest concentration of SARS-CoV-2 viral copies this assay can detect is 138 copies/mL. A negative result does not preclude SARS-Cov-2 infection and should not be used as the sole basis for treatment or other patient management decisions. A negative result may occur with  improper specimen collection/handling, submission of specimen other than nasopharyngeal swab, presence of viral mutation(s) within the areas targeted by this assay, and inadequate number of viral copies(<138 copies/mL). A negative  result must be combined with clinical observations, patient history, and epidemiological information. The expected result is Negative.  Fact Sheet for Patients:  EntrepreneurPulse.com.au  Fact Sheet for Healthcare Providers:  IncredibleEmployment.be  This test is no t yet approved or cleared by the [place] FDA and  has been authorized for detection and/or diagnosis of SARS-CoV-2 by FDA under an Emergency Use Authorization (EUA). This EUA will remain  in  effect (meaning this test can be used) for the duration of the COVID-19 declaration under Section 564(b)(1) of the Act, 21 U.S.C.section 360bbb-3(b)(1), unless the authorization is terminated  or revoked sooner.       Influenza A by PCR NEGATIVE NEGATIVE Final   Influenza B by PCR NEGATIVE NEGATIVE Final    Comment: (NOTE) The Xpert Xpress SARS-CoV-2/FLU/RSV plus assay is intended as an aid in the diagnosis of influenza from Nasopharyngeal swab specimens and should not be used as a sole basis for treatment. Nasal washings and aspirates are unacceptable for Xpert Xpress SARS-CoV-2/FLU/RSV testing.  Fact Sheet for Patients: EntrepreneurPulse.com.au  Fact Sheet for Healthcare Providers: IncredibleEmployment.be  This test is not yet approved or cleared by the [place] FDA and has been authorized for detection and/or diagnosis of SARS-CoV-2 by FDA under an Emergency Use Authorization (EUA). This EUA will remain in effect (meaning this test can be used) for the duration of the COVID-19 declaration under Section 564(b)(1) of the Act, 21 U.S.C. section 360bbb-3(b)(1), unless the authorization is terminated or revoked.  Performed at [facility], [address] [place], [place] [address]      Labs: BNP (last 3 results) Recent Labs    11/30/20 1828  BNP 027.2*   Basic Metabolic Panel: Recent Labs  Lab 11/30/20 1827 12/01/20 1247   NA 139  --   K 4.2  --   CL 100  --   CO2 28  --   GLUCOSE 392*  --   BUN 14  --   CREATININE 0.87 0.86  CALCIUM 9.3  --   MG 2.8*  --    Liver Function Tests: Recent Labs  Lab 11/30/20 1827  AST 14*  ALT 12  ALKPHOS 62  BILITOT 0.5  PROT 7.4  ALBUMIN 4.0   No results for input(s): LIPASE, AMYLASE in the last 168 hours. No results for input(s): AMMONIA in the last 168 hours. CBC: Recent Labs  Lab 11/30/20 1827 12/01/20 1247  WBC 8.5 9.8  NEUTROABS 7.2  --   HGB 16.2* 14.9  HCT 48.1* 43.9  MCV 87.1 86.1  PLT 238 224   Cardiac Enzymes: No results for input(s): CKTOTAL, CKMB, CKMBINDEX, TROPONINI in the last 168 hours. BNP: Invalid input(s): POCBNP CBG: Recent Labs  Lab 11/30/20 2148 12/01/20 0913 12/01/20 1127  GLUCAP 378* 310* 330*   D-Dimer No results for input(s): DDIMER in the last 72 hours. Hgb A1c Recent Labs    11/30/20 2027  HGBA1C 11.7*   Lipid Profile No results for input(s): CHOL, HDL, LDLCALC, TRIG, CHOLHDL, LDLDIRECT in the last 72 hours. Thyroid function studies No results for input(s): TSH, T4TOTAL, T3FREE, THYROIDAB in the last 72 hours.  Invalid input(s): FREET3 Anemia work up No results for input(s): VITAMINB12, FOLATE, FERRITIN, TIBC, IRON, RETICCTPCT in the last 72 hours. Urinalysis    Component Value Date/Time   COLORURINE YELLOW (A) 07/06/2020 0630   APPEARANCEUR CLEAR (A) 07/06/2020 0630   LABSPEC 1.014 07/06/2020 0630   PHURINE 6.0 07/06/2020 0630   GLUCOSEU >=500 (A) 07/06/2020 0630   HGBUR MODERATE (A) 07/06/2020 0630   BILIRUBINUR NEGATIVE 07/06/2020 0630   KETONESUR 5 (A) 07/06/2020 0630   PROTEINUR NEGATIVE 07/06/2020 0630   NITRITE NEGATIVE 07/06/2020 0630   LEUKOCYTESUR NEGATIVE 07/06/2020 0630   Sepsis Labs Invalid input(s): PROCALCITONIN,  WBC,  LACTICIDVEN Microbiology Recent Results (from the past 240 hour(s))  Resp Panel by RT-PCR (Flu A&B, Covid) Nasopharyngeal Swab     Status: None  Collection  Time: 11/30/20  6:35 PM   Specimen: Nasopharyngeal Swab; Nasopharyngeal(NP) swabs in vial transport medium  Result Value Ref Range Status   SARS Coronavirus 2 by RT PCR NEGATIVE NEGATIVE Final    Comment: (NOTE) SARS-CoV-2 target nucleic acids are NOT DETECTED.  The SARS-CoV-2 RNA is generally detectable in upper respiratory specimens during the acute phase of infection. The lowest concentration of SARS-CoV-2 viral copies this assay can detect is 138 copies/mL. A negative result does not preclude SARS-Cov-2 infection and should not be used as the sole basis for treatment or other patient management decisions. A negative result may occur with  improper specimen collection/handling, submission of specimen other than nasopharyngeal swab, presence of viral mutation(s) within the areas targeted by this assay, and inadequate number of viral copies(<138 copies/mL). A negative result must be combined with clinical observations, patient history, and epidemiological information. The expected result is Negative.  Fact Sheet for Patients:  EntrepreneurPulse.com.au  Fact Sheet for Healthcare Providers:  IncredibleEmployment.be  This test is no t yet approved or cleared by the [place] FDA and  has been authorized for detection and/or diagnosis of SARS-CoV-2 by FDA under an Emergency Use Authorization (EUA). This EUA will remain  in effect (meaning this test can be used) for the duration of the COVID-19 declaration under Section 564(b)(1) of the Act, 21 U.S.C.section 360bbb-3(b)(1), unless the authorization is terminated  or revoked sooner.       Influenza A by PCR NEGATIVE NEGATIVE Final   Influenza B by PCR NEGATIVE NEGATIVE Final    Comment: (NOTE) The Xpert Xpress SARS-CoV-2/FLU/RSV plus assay is intended as an aid in the diagnosis of influenza from Nasopharyngeal swab specimens and should not be used as a sole basis for treatment. Nasal washings  and aspirates are unacceptable for Xpert Xpress SARS-CoV-2/FLU/RSV testing.  Fact Sheet for Patients: EntrepreneurPulse.com.au  Fact Sheet for Healthcare Providers: IncredibleEmployment.be  This test is not yet approved or cleared by the [place] FDA and has been authorized for detection and/or diagnosis of SARS-CoV-2 by FDA under an Emergency Use Authorization (EUA). This EUA will remain in effect (meaning this test can be used) for the duration of the COVID-19 declaration under Section 564(b)(1) of the Act, 21 U.S.C. section 360bbb-3(b)(1), unless the authorization is terminated or revoked.  Performed at [facility], [address] [place], [place] [address]     Time coordinating discharge: Over 30 minutes  SIGNED:  Lorella Nimrod, MD  [facility] 12/01/2020, 4:29 PM  If 7PM-7AM, please contact night-coverage [email or web address]  This record has been created using Systems analyst. Errors have been sought and corrected,but may not always be located. Such creation errors do not reflect on the standard of care.

## 2020-12-01 NOTE — Progress Notes (Signed)
[facility] for Heparin Infusion   Indication: ACS/STEMI  Allergies  Allergen Reactions  . Penicillins Hives  . Simvastatin Hives and Other (See Comments)  . Iron Itching and Other (See Comments)    Pt. States doesn't bother Pt. States doesn't bother     Patient Measurements: Height: 4\' 11"  (149.9 cm) Weight: 79.8 kg (176 lb) IBW/kg (Calculated) : 43.2 Heparin Dosing Weight: 61.7 kg  Vital Signs: Temp: 98.7 F (37.1 C) (12/06 1800) Temp Source: Oral (12/06 1800) BP: 147/70 (12/07 0400) Pulse Rate: 59 (12/07 0400)  Labs: Recent Labs    11/30/20 1827 11/30/20 1900 11/30/20 2027 12/01/20 0001 12/01/20 0409  HGB 16.2*  --   --   --   --   HCT 48.1*  --   --   --   --   PLT 238  --   --   --   --   APTT  --  30  --   --   --   LABPROT 13.1  --   --   --   --   INR 1.0  --   --   --   --   HEPARINUNFRC  --   --   --   --  0.25*  CREATININE 0.87  --   --   --   --   TROPONINIHS 79*  --  111* 96*  --     Estimated Creatinine Clearance: 47.1 mL/min (by C-G formula based on SCr of 0.87 mg/dL).   Medical History: Past Medical History:  Diagnosis Date  . Breast cancer ([place])   . CAD (coronary artery disease)   . Diastolic CHF ([place])   . Hypertension   . Pacemaker   . Paroxysmal atrial fibrillation ([facility])     Assessment: Pt given 4000 units heparin in ER.  Goal of Therapy:  Heparin level 0.3-0.7 units/ml Monitor platelets by anticoagulation protocol: Yes   Plan:  Ordered baseline aPTT level Start heparin infusion at 750 units/hr Check anti-Xa level in 8 hours and daily while on heparin Continue to monitor H&H and platelets   12/7 @ 0409 HL 0.25, subtherapeutic.  Will increase Heparin to 950 units/hr and recheck HL ~ 8 hrs after increase.   Hart Robinsons, PharmD Clinical Pharmacist   12/01/2020 4:46 AM.

## 2020-12-01 NOTE — ED Notes (Signed)
Pt ambulatory to the toilet in the room with sl assist

## 2020-12-02 ENCOUNTER — Ambulatory Visit: Payer: Medicare Other | Admitting: Family

## 2020-12-02 NOTE — Progress Notes (Deleted)
Office Visit    Patient Name: Tara Sandoval Date of Encounter: 12/02/2020  Primary Care Provider:  System, Provider Not In Primary Cardiologist:  No primary care provider on file. Electrophysiologist:  None   Chief Complaint    Tara Sandoval is a 79 y.o. female with a hx of *** presents today for hospital follow-up  Past Medical History    Past Medical History:  Diagnosis Date  . Breast cancer ([place])   . CAD (coronary artery disease)   . Diastolic CHF ([place])   . Hypertension   . Pacemaker   . Paroxysmal atrial fibrillation [facility])    Past Surgical History:  Procedure Laterality Date  . PACEMAKER IMPLANT      Allergies  Allergies  Allergen Reactions  . Penicillins Hives  . Simvastatin Hives and Other (See Comments)  . Iron Itching and Other (See Comments)    Pt. States doesn't bother Pt. States doesn't bother     History of Present Illness    Tara Sandoval is a 79 y.o. female with a hx of *** last seen while hospitalized..  Admitted 12/20/20.***  EKGs/Labs/Other Studies Reviewed:   The following studies were reviewed today: Echo 12/01/2020 1. Left ventricular ejection fraction, by estimation, is 70 to 75%. The  left ventricle has hyperdynamic function. The left ventricle has no  regional wall motion abnormalities. There is moderate left ventricular  hypertrophy. Left ventricular diastolic  parameters are consistent with Grade I diastolic dysfunction (impaired  relaxation). Elevated left atrial pressure. The average left ventricular  global longitudinal strain is -13.2 %. The global longitudinal strain is  abnormal.   2. Right ventricular systolic function is normal. The right ventricular  size is normal. There is normal pulmonary artery systolic pressure.   3. The mitral valve is normal in structure. Trivial mitral valve  regurgitation.   4. The aortic valve has an indeterminant number of cusps. There is mild  calcification of the aortic valve. There is  moderate thickening of the  aortic valve. Aortic valve regurgitation is not visualized. Mild to  moderate aortic valve  sclerosis/calcification is present, without any evidence of aortic  stenosis.   5. Pulmonic valve regurgitation not well assessed.   6. The inferior vena cava is normal in size with greater than 50%  respiratory variability, suggesting right atrial pressure of 3 mmHg.   EKG:  EKG is  ordered today.  The ekg ordered today demonstrates ***  Recent Labs: 11/30/2020: ALT 12; B Natriuretic Peptide 165.5; BUN 14; Magnesium 2.8; Potassium 4.2; Sodium 139 12/01/2020: Creatinine, Ser 0.86; Hemoglobin 14.9; Platelets 224  Recent Lipid Panel No results found for: CHOL, TRIG, HDL, CHOLHDL, VLDL, LDLCALC, LDLDIRECT  Home Medications   No outpatient medications have been marked as taking for the 12/02/20 encounter (Appointment) with Loel Dubonnet, NP.     Review of Systems   ***   ROS All other systems reviewed and are otherwise negative except as noted above.  Physical Exam    VS:  There were no vitals taken for this visit. , BMI There is no height or weight on file to calculate BMI.  Wt Readings from Last 3 Encounters:  12/01/20 159 lb 14.4 oz (72.5 kg)     GEN: Well nourished, well developed, in no acute distress. HEENT: normal. Neck: Supple, no JVD, carotid bruits, or masses. Cardiac: ***RRR, no murmurs, rubs, or gallops. No clubbing, cyanosis, edema.  ***Radials/DP/PT 2+ and equal bilaterally.  Respiratory:  ***Respirations regular  and unlabored, clear to auscultation bilaterally. GI: Soft, nontender, nondistended. MS: No deformity or atrophy. Skin: Warm and dry, no rash. Neuro:  Strength and sensation are intact. Psych: Normal affect.  Assessment & Plan    1. CAD- 2. HFpEF- 3. hypertensive heart disease- 4. PAF-  Disposition: Follow up {follow up:15908} with ***   Signed, Loel Dubonnet, NP 12/02/2020, 12:53 PM [facility]

## 2021-09-10 ENCOUNTER — Other Ambulatory Visit: Payer: Self-pay

## 2021-09-10 ENCOUNTER — Emergency Department: Payer: Medicare Other

## 2021-09-10 ENCOUNTER — Emergency Department
Admission: EM | Admit: 2021-09-10 | Discharge: 2021-09-10 | Disposition: A | Discharge status: Home / Self Care | Payer: Medicare Other | Attending: Emergency Medicine | Admitting: Emergency Medicine

## 2021-09-10 DIAGNOSIS — I110 Hypertensive heart disease with heart failure: Secondary | ICD-10-CM | POA: Insufficient documentation

## 2021-09-10 DIAGNOSIS — Z794 Long term (current) use of insulin: Secondary | ICD-10-CM | POA: Insufficient documentation

## 2021-09-10 DIAGNOSIS — I2510 Atherosclerotic heart disease of native coronary artery without angina pectoris: Secondary | ICD-10-CM | POA: Insufficient documentation

## 2021-09-10 DIAGNOSIS — Z950 Presence of cardiac pacemaker: Secondary | ICD-10-CM | POA: Insufficient documentation

## 2021-09-10 DIAGNOSIS — S79911A Unspecified injury of right hip, initial encounter: Secondary | ICD-10-CM | POA: Diagnosis present

## 2021-09-10 DIAGNOSIS — I5030 Unspecified diastolic (congestive) heart failure: Secondary | ICD-10-CM | POA: Diagnosis not present

## 2021-09-10 DIAGNOSIS — Z853 Personal history of malignant neoplasm of breast: Secondary | ICD-10-CM | POA: Diagnosis not present

## 2021-09-10 DIAGNOSIS — W010XXA Fall on same level from slipping, tripping and stumbling without subsequent striking against object, initial encounter: Secondary | ICD-10-CM | POA: Diagnosis not present

## 2021-09-10 DIAGNOSIS — S4991XA Unspecified injury of right shoulder and upper arm, initial encounter: Secondary | ICD-10-CM | POA: Insufficient documentation

## 2021-09-10 DIAGNOSIS — Y92000 Kitchen of unspecified non-institutional (private) residence as  the place of occurrence of the external cause: Secondary | ICD-10-CM | POA: Diagnosis not present

## 2021-09-10 DIAGNOSIS — R4182 Altered mental status, unspecified: Secondary | ICD-10-CM | POA: Insufficient documentation

## 2021-09-10 DIAGNOSIS — T1490XA Injury, unspecified, initial encounter: Secondary | ICD-10-CM

## 2021-09-10 DIAGNOSIS — Z79899 Other long term (current) drug therapy: Secondary | ICD-10-CM | POA: Insufficient documentation

## 2021-09-10 DIAGNOSIS — W19XXXA Unspecified fall, initial encounter: Secondary | ICD-10-CM

## 2021-09-10 DIAGNOSIS — N390 Urinary tract infection, site not specified: Secondary | ICD-10-CM

## 2021-09-10 LAB — CBC WITH DIFFERENTIAL/PLATELET
Abs Immature Granulocytes: 0.03 10*3/uL (ref 0.00–0.07)
Basophils Absolute: 0 10*3/uL (ref 0.0–0.1)
Basophils Relative: 0 %
Eosinophils Absolute: 0.2 10*3/uL (ref 0.0–0.5)
Eosinophils Relative: 2 %
HCT: 45.2 % (ref 36.0–46.0)
Hemoglobin: 15.6 g/dL — ABNORMAL HIGH (ref 12.0–15.0)
Immature Granulocytes: 0 %
Lymphocytes Relative: 22 %
Lymphs Abs: 2.2 10*3/uL (ref 0.7–4.0)
MCH: 29.8 pg (ref 26.0–34.0)
MCHC: 34.5 g/dL (ref 30.0–36.0)
MCV: 86.3 fL (ref 80.0–100.0)
Monocytes Absolute: 0.9 10*3/uL (ref 0.1–1.0)
Monocytes Relative: 8 %
Neutro Abs: 7 10*3/uL (ref 1.7–7.7)
Neutrophils Relative %: 68 %
Platelets: 249 10*3/uL (ref 150–400)
RBC: 5.24 MIL/uL — ABNORMAL HIGH (ref 3.87–5.11)
RDW: 13 % (ref 11.5–15.5)
WBC: 10.4 10*3/uL (ref 4.0–10.5)
nRBC: 0 % (ref 0.0–0.2)

## 2021-09-10 LAB — URINALYSIS, COMPLETE (UACMP) WITH MICROSCOPIC
Bacteria, UA: NONE SEEN
Bilirubin Urine: NEGATIVE
Glucose, UA: >=500 mg/dL — AB
Ketones, ur: NEGATIVE mg/dL
Nitrite: NEGATIVE
Protein, ur: 30 mg/dL — AB
Specific Gravity, Urine: 1.035 — ABNORMAL HIGH (ref 1.005–1.030)
pH: 5 (ref 5.0–8.0)

## 2021-09-10 LAB — COMPREHENSIVE METABOLIC PANEL
ALT: 11 U/L (ref 0–44)
AST: 14 U/L — ABNORMAL LOW (ref 15–41)
Albumin: 3.6 g/dL (ref 3.5–5.0)
Alkaline Phosphatase: 57 U/L (ref 38–126)
Anion gap: 9 (ref 5–15)
BUN: 9 mg/dL (ref 8–23)
CO2: 28 mmol/L (ref 22–32)
Calcium: 8.6 mg/dL — ABNORMAL LOW (ref 8.9–10.3)
Chloride: 101 mmol/L (ref 98–111)
Creatinine, Ser: 0.78 mg/dL (ref 0.44–1.00)
GFR, Estimated: >60 mL/min (ref >60)
Glucose, Bld: 321 mg/dL — ABNORMAL HIGH (ref 70–99)
Potassium: 3.6 mmol/L (ref 3.5–5.1)
Sodium: 138 mmol/L (ref 135–145)
Total Bilirubin: 0.4 mg/dL (ref 0.3–1.2)
Total Protein: 6.9 g/dL (ref 6.5–8.1)

## 2021-09-10 LAB — TROPONIN I (HIGH SENSITIVITY)
Troponin I (High Sensitivity): 12 ng/L (ref <18)
Troponin I (High Sensitivity): 13 ng/L (ref <18)

## 2021-09-10 MED ORDER — NITROFURANTOIN MONOHYD MACRO 100 MG PO CAPS: 100.0000 mg | ORAL_CAPSULE | Freq: Two times a day (BID) | ORAL | 0 refills | Status: AC

## 2021-09-10 MED ORDER — ACETAMINOPHEN 500 MG PO TABS
1000.0000 mg | ORAL_TABLET | Freq: Once | ORAL | Status: AC
  Administered 2021-09-10: 1000 mg via ORAL
  Filled 2021-09-10: qty 2

## 2021-09-10 MED ORDER — NITROFURANTOIN MONOHYD MACRO 100 MG PO CAPS
100.0000 mg | ORAL_CAPSULE | Freq: Once | ORAL | Status: AC
  Administered 2021-09-10: 100 mg via ORAL
  Filled 2021-09-10: qty 1

## 2021-09-10 NOTE — ED Notes (Signed)
Pt resting comfortably at this time. Normal rise and fall of chest. NAD noted at this time. Pt waiting for a CT scan.

## 2021-09-10 NOTE — ED Notes (Signed)
Pt brought to restroom via W/C and assisted to toilet to provide UA sample.   Pt also had a BM. UA uncontaminated by stool and sent to lab.

## 2021-09-10 NOTE — ED Notes (Signed)
Pt at CT

## 2021-09-10 NOTE — ED Provider Notes (Signed)
[facility]  ____________________________________________   Event Date/Time   First MD Initiated Contact with Patient 09/10/21 [phone]     (approximate)  I have reviewed the triage vital signs and the nursing notes.   HISTORY  Chief Complaint Fall    HPI Tara Sandoval is a 80 y.o. female past medical history of CAD, paroxysmal A. fib, diastolic heart failure who presents after a fall.  Patient tells me that she was playing with her granddaughter on the bed and then when she went to get off she lost her balance and fell onto her right side.  She denies hitting her head.  She endorses right shoulder and right hip pain.  She denies preceding lightheadedness.  Denies chest pain or difficulty breathing.   Patient's daughter August called and spoke with nursing noting that she was more confused today.           Past Medical History:  Diagnosis Date   Breast cancer ([place])    CAD (coronary artery disease)    Diastolic CHF ([place])    Hypertension    Pacemaker    Paroxysmal atrial fibrillation [facility])     Patient Active Problem List   Diagnosis Date Noted   SOB (shortness of breath) 11/30/2020   Seizure ([place]) 07/06/2020   Hypertensive emergency 07/06/2020   Hyperglycemia due to type 2 diabetes mellitus ([place]) 07/06/2020   History of CVA (cerebrovascular accident) 07/06/2020   CAD (coronary artery disease) 07/06/2020   AF (paroxysmal atrial fibrillation) ([place]) 07/06/2020   Pacemaker 07/06/2020   Hypertensive emergency without congestive heart failure 07/06/2020   OSA (obstructive sleep apnea) 09/04/2015   Breast cancer ([place]) 04/25/2009    Past Surgical History:  Procedure Laterality Date   PACEMAKER IMPLANT      Prior to Admission medications   Medication Sig Start Date End Date Taking? Authorizing Provider  acetaminophen (TYLENOL) 650 MG CR tablet Take 650 mg by mouth every 8 (eight) hours as needed for pain. 08/18/17   [provider]   albuterol (PROVENTIL) (2.5 MG/3ML) 0.083% nebulizer solution Inhale 2.5 mg into the lungs every 4 (four) hours as needed. Patient not taking: Reported on 11/30/2020 05/20/11   [provider]  albuterol (VENTOLIN HFA) 108 (90 Base) MCG/ACT inhaler Inhale 2 puffs into the lungs every 6 (six) hours as needed for wheezing or shortness of breath.    [provider]  amLODipine (NORVASC) 10 MG tablet Take 1 tablet (10 mg total) by mouth daily. 12/02/20   Lorella Nimrod, MD  aspirin 81 MG EC tablet Take 81 mg by mouth daily. Patient not taking: Reported on 11/30/2020    [provider]  atorvastatin (LIPITOR) 80 MG tablet Take 80 mg by mouth daily. 05/27/20   [provider]  carvedilol (COREG) 25 MG tablet Take 25 mg by mouth 2 (two) times daily. 05/27/20   [provider]  hydrALAZINE (APRESOLINE) 25 MG tablet Take 25 mg by mouth in the morning and at bedtime.  02/19/19   [provider]  insulin aspart (NOVOLOG) 100 UNIT/ML injection Inject 12-15 Units into the skin 3 (three) times daily with meals. 01/21/19   [provider]  Insulin Glargine (BASAGLAR KWIKPEN) 100 UNIT/ML Inject 28 Units into the skin 2 (two) times daily.    [provider]  liraglutide (VICTOZA) 18 MG/3ML SOPN Inject 0.6 mg into the skin as needed. If BS over 600    [provider]  lisinopril (ZESTRIL) 20 MG tablet  Take 20 mg by mouth daily. 05/28/20   [provider]  nitroGLYCERIN (NITROSTAT) 0.4 MG SL tablet Place 0.4 mg under the tongue See admin instructions. Place 1 tablet (0.5 mg) under the tongue every five minutes as needed for chest pain. May take up to three doses.    [provider]  RYBELSUS 7 MG TABS Take 1 tablet by mouth daily. Patient not taking: Reported on 11/30/2020 06/23/20   [provider]    Allergies Penicillins, Simvastatin, and Iron  No family history on file.  Social History Social History   Tobacco  Use   Smoking status: Never   Smokeless tobacco: Never  Substance Use Topics   Alcohol use: Not Currently   Drug use: Not Currently    Review of Systems   Review of Systems  Constitutional:  Negative for chills and fever.  Respiratory:  Negative for shortness of breath.   Cardiovascular:  Negative for chest pain.  Genitourinary:  Negative for dysuria and urgency.  Musculoskeletal:  Positive for arthralgias and myalgias. Negative for back pain and neck pain.  Neurological:  Negative for light-headedness and headaches.  All other systems reviewed and are negative.  Physical Exam Updated Vital Signs BP (!) 158/84   Pulse 65   Temp 98.3 F (36.8 C) (Oral)   Resp 16   Ht '5\' 4"'$  (1.626 m)   Wt 77.1 kg   SpO2 96%   BMI 29.18 kg/m   Physical Exam Vitals and nursing note reviewed.  Constitutional:      General: She is not in acute distress.    Appearance: Normal appearance.  HENT:     Head: Normocephalic and atraumatic.  Eyes:     General: No scleral icterus.    Conjunctiva/sclera: Conjunctivae normal.  Cardiovascular:     Rate and Rhythm: Normal rate and regular rhythm.  Pulmonary:     Effort: Pulmonary effort is normal. No respiratory distress.     Breath sounds: No stridor.  Abdominal:     General: Abdomen is flat. There is no distension.     Tenderness: There is no abdominal tenderness.  Musculoskeletal:        General: No deformity or signs of injury.     Cervical back: Normal range of motion.     Comments: Mild tenderness to palpation of the right anterior shoulder, range of motion preserved Right hip is nontender to palpation Range of motion in the bilateral hips is normal No bony tenderness of the bilateral hips knees or ankles  Skin:    General: Skin is dry.     Coloration: Skin is not jaundiced or pale.  Neurological:     General: No focal deficit present.     Mental Status: She is alert and oriented to person, place, and time. Mental status is at  baseline.  Psychiatric:        Mood and Affect: Mood normal.        Behavior: Behavior normal.     LABS (all labs ordered are listed, but only abnormal results are displayed)  Labs Reviewed  CBC WITH DIFFERENTIAL/PLATELET - Abnormal; Notable for the following components:      Result Value   RBC 5.24 (*)    Hemoglobin 15.6 (*)    All other components within normal limits  COMPREHENSIVE METABOLIC PANEL  URINALYSIS, COMPLETE (UACMP) WITH MICROSCOPIC  TROPONIN I (HIGH SENSITIVITY)   ____________________________________________  EKG  Atrial paced, normal axis, interventricular conduction delay, no criteria for [place]'s ____________________________________________  RADIOLOGY I, Madelin Headings, personally viewed and evaluated these images (plain radiographs) as part of my medical decision making, as well as reviewing the written report by the radiologist.  ED MD interpretation: I reviewed the x-ray of the right shoulder which does not show any acute fracture dislocation  I reviewed the x-ray of the right hip which not show any acute fracture dislocation  I reviewed the x-ray of the abdomen which shows normal bowel gas pattern    ____________________________________________   PROCEDURES  Procedure(s) performed (including Critical Care):  Procedures   ____________________________________________   INITIAL IMPRESSION / ASSESSMENT AND PLAN / ED COURSE     80 year old female presents after a mechanical fall.  Patient did not hit her head chest, she mainly complains of some right shoulder and hip pain.  X-rays obtained which does not show an acute fracture.  She has normal range of motion and is nontender my suspicion for occult fracture of the hip is low.  Patient's daughter did call nursing saying that she was more altered tonight than normal.  Patient is alert and oriented, although she is somewhat nonlinear and difficult to get a history from.  I I was able to  speak with the patient's daughter who notes that she was more confused today, they found her in her closet and she had thought that there was a bare loose in the house.  Patient currently has physical therapy at home, has had frequent falls.  Her daughter thinks she may ultimately need to be placed in a nursing home.  Given this change in mental status will get a CT head and labs to rule out infectious or metabolic cause of her acute worsening.  If all within normal limits I do think that she be appropriate for discharge home.  They have a primary care physician who could help with placement.  Patient's daughter is on board with this plan.    Patient's labs are reassuring.  Repeat troponins are negative.  EKG is nonischemic.  At the time of signout she is pending a UA and a CT.  ____________________________________________   FINAL CLINICAL IMPRESSION(S) / ED DIAGNOSES  Final diagnoses:  Injury     ED Discharge Orders     None        Note:  This document was prepared using Dragon voice recognition software and may include unintentional dictation errors.    Rada Hay, MD 09/11/21 [phone]

## 2021-09-10 NOTE — Discharge Instructions (Addendum)
Your blood work, CAT scan of your brain and x-rays were reassuring today.  Please follow-up with your primary care provider to arrange any additional home health care or placement in a skilled nursing facility.

## 2021-09-10 NOTE — ED Notes (Signed)
Pt given meal tray.

## 2021-09-10 NOTE — ED Notes (Signed)
D/C and reasons to return to ED discussed with pt. Pt given first dose of Macrobid on D/C. Daughter made aware of first dose and to p/up RX. Pt sent home with bottom dentures in a pink denture cup and also sent home with personal cane. NAD noted. VSS.

## 2021-09-10 NOTE — ED Triage Notes (Signed)
Pt brought in from home due to fall. Pt states she fell around 1700 yesterday states she slipped and fell in the kitchen. Pt denies any head or neck pain. Pt co right shoulder and right upper arm pain. Pt also co right hip pain, has been able to walk. Also co no BM x 5 days.

## 2021-09-10 NOTE — ED Notes (Signed)
Pts daughter updated at this time, per daughter pt was found in the closet this evening saying that there were bears outside her room. Daughter states that pt has been more confused recently

## 2021-09-13 LAB — URINE CULTURE

## 2022-01-06 ENCOUNTER — Emergency Department: Payer: Medicare Other

## 2022-01-06 ENCOUNTER — Other Ambulatory Visit: Payer: Self-pay

## 2022-01-06 ENCOUNTER — Emergency Department
Admission: EM | Admit: 2022-01-06 | Discharge: 2022-01-14 | Disposition: A | Discharge status: Home / Self Care | Payer: Medicare Other | Attending: Emergency Medicine | Admitting: Emergency Medicine

## 2022-01-06 DIAGNOSIS — R739 Hyperglycemia, unspecified: Secondary | ICD-10-CM | POA: Diagnosis not present

## 2022-01-06 DIAGNOSIS — M79604 Pain in right leg: Secondary | ICD-10-CM | POA: Diagnosis not present

## 2022-01-06 DIAGNOSIS — I82432 Acute embolism and thrombosis of left popliteal vein: Secondary | ICD-10-CM

## 2022-01-06 DIAGNOSIS — I82492 Acute embolism and thrombosis of other specified deep vein of left lower extremity: Secondary | ICD-10-CM | POA: Diagnosis not present

## 2022-01-06 DIAGNOSIS — F0390 Unspecified dementia without behavioral disturbance: Secondary | ICD-10-CM | POA: Diagnosis not present

## 2022-01-06 DIAGNOSIS — U071 COVID-19: Secondary | ICD-10-CM | POA: Insufficient documentation

## 2022-01-06 DIAGNOSIS — M79605 Pain in left leg: Secondary | ICD-10-CM | POA: Diagnosis present

## 2022-01-06 LAB — CBC WITH DIFFERENTIAL/PLATELET
Abs Immature Granulocytes: 0.02 10*3/uL (ref 0.00–0.07)
Basophils Absolute: 0 10*3/uL (ref 0.0–0.1)
Basophils Relative: 0 %
Eosinophils Absolute: 0.3 10*3/uL (ref 0.0–0.5)
Eosinophils Relative: 5 %
HCT: 45.4 % (ref 36.0–46.0)
Hemoglobin: 15.5 g/dL — ABNORMAL HIGH (ref 12.0–15.0)
Immature Granulocytes: 0 %
Lymphocytes Relative: 18 %
Lymphs Abs: 1.3 10*3/uL (ref 0.7–4.0)
MCH: 29.6 pg (ref 26.0–34.0)
MCHC: 34.1 g/dL (ref 30.0–36.0)
MCV: 86.8 fL (ref 80.0–100.0)
Monocytes Absolute: 0.5 10*3/uL (ref 0.1–1.0)
Monocytes Relative: 8 %
Neutro Abs: 4.8 10*3/uL (ref 1.7–7.7)
Neutrophils Relative %: 69 %
Platelets: 243 10*3/uL (ref 150–400)
RBC: 5.23 MIL/uL — ABNORMAL HIGH (ref 3.87–5.11)
RDW: 13.2 % (ref 11.5–15.5)
WBC: 7 10*3/uL (ref 4.0–10.5)
nRBC: 0 % (ref 0.0–0.2)

## 2022-01-06 LAB — URINALYSIS, ROUTINE W REFLEX MICROSCOPIC
Bacteria, UA: NONE SEEN
Bilirubin Urine: NEGATIVE
Glucose, UA: >=500 mg/dL — AB
Hgb urine dipstick: NEGATIVE
Ketones, ur: NEGATIVE mg/dL
Leukocytes,Ua: NEGATIVE
Nitrite: NEGATIVE
Protein, ur: NEGATIVE mg/dL
Specific Gravity, Urine: 1.015 (ref 1.005–1.030)
pH: 6 (ref 5.0–8.0)

## 2022-01-06 LAB — COMPREHENSIVE METABOLIC PANEL
ALT: 10 U/L (ref 0–44)
AST: 17 U/L (ref 15–41)
Albumin: 3.7 g/dL (ref 3.5–5.0)
Alkaline Phosphatase: 50 U/L (ref 38–126)
Anion gap: 4 — ABNORMAL LOW (ref 5–15)
BUN: 13 mg/dL (ref 8–23)
CO2: 31 mmol/L (ref 22–32)
Calcium: 8.6 mg/dL — ABNORMAL LOW (ref 8.9–10.3)
Chloride: 99 mmol/L (ref 98–111)
Creatinine, Ser: 0.87 mg/dL (ref 0.44–1.00)
GFR, Estimated: >60 mL/min (ref >60)
Glucose, Bld: 309 mg/dL — ABNORMAL HIGH (ref 70–99)
Potassium: 4.7 mmol/L (ref 3.5–5.1)
Sodium: 134 mmol/L — ABNORMAL LOW (ref 135–145)
Total Bilirubin: 0.8 mg/dL (ref 0.3–1.2)
Total Protein: 7.3 g/dL (ref 6.5–8.1)

## 2022-01-06 LAB — RESP PANEL BY RT-PCR (FLU A&B, COVID) ARPGX2
Influenza A by PCR: NEGATIVE
Influenza B by PCR: NEGATIVE
SARS Coronavirus 2 by RT PCR: NEGATIVE

## 2022-01-06 LAB — CK: Total CK: 68 U/L (ref 38–234)

## 2022-01-06 MED ORDER — ATORVASTATIN CALCIUM 20 MG PO TABS: 80.0000 mg | ORAL_TABLET | Freq: Every day | ORAL | Status: DC

## 2022-01-06 MED ORDER — GLIPIZIDE ER 2.5 MG PO TB24
2.5000 mg | ORAL_TABLET | Freq: Every day | ORAL | Status: DC
  Administered 2022-01-07 – 2022-01-14 (×8): 2.5 mg via ORAL
  Filled 2022-01-06 (×12): qty 1

## 2022-01-06 MED ORDER — INSULIN ASPART 100 UNIT/ML ~~LOC~~ SOLN: 10.0000 [IU] | Freq: Three times a day (TID) | SUBCUTANEOUS | Status: DC

## 2022-01-06 MED ORDER — CARVEDILOL 25 MG PO TABS
25.0000 mg | ORAL_TABLET | Freq: Two times a day (BID) | ORAL | Status: DC
  Administered 2022-01-08 – 2022-01-14 (×13): 25 mg via ORAL
  Filled 2022-01-06 (×15): qty 1

## 2022-01-06 MED ORDER — INSULIN GLARGINE-YFGN 100 UNIT/ML ~~LOC~~ SOLN
28.0000 [IU] | Freq: Two times a day (BID) | SUBCUTANEOUS | Status: DC
  Administered 2022-01-07 – 2022-01-13 (×12): 28 [IU] via SUBCUTANEOUS
  Filled 2022-01-06 (×17): qty 0.28

## 2022-01-06 MED ORDER — LISINOPRIL 10 MG PO TABS: 20.0000 mg | ORAL_TABLET | Freq: Every day | ORAL | Status: DC

## 2022-01-06 MED ORDER — ALBUTEROL SULFATE (2.5 MG/3ML) 0.083% IN NEBU: 2.5000 mg | INHALATION_SOLUTION | Freq: Four times a day (QID) | RESPIRATORY_TRACT | Status: DC | PRN

## 2022-01-06 MED ORDER — LISINOPRIL 10 MG PO TABS
20.0000 mg | ORAL_TABLET | Freq: Every day | ORAL | Status: DC
  Administered 2022-01-07 – 2022-01-14 (×8): 20 mg via ORAL
  Filled 2022-01-06 (×8): qty 2

## 2022-01-06 MED ORDER — HYDRALAZINE HCL 50 MG PO TABS
25.0000 mg | ORAL_TABLET | Freq: Two times a day (BID) | ORAL | Status: DC
  Administered 2022-01-07 – 2022-01-14 (×15): 25 mg via ORAL
  Filled 2022-01-06 (×15): qty 1

## 2022-01-06 MED ORDER — INSULIN ASPART 100 UNIT/ML IJ SOLN
0.0000 [IU] | Freq: Three times a day (TID) | INTRAMUSCULAR | Status: DC
  Administered 2022-01-07: 11 [IU] via SUBCUTANEOUS
  Administered 2022-01-07: 5 [IU] via SUBCUTANEOUS
  Administered 2022-01-07: 2 [IU] via SUBCUTANEOUS
  Administered 2022-01-08 (×2): 3 [IU] via SUBCUTANEOUS
  Administered 2022-01-09: 2 [IU] via SUBCUTANEOUS
  Administered 2022-01-09 – 2022-01-14 (×8): 3 [IU] via SUBCUTANEOUS
  Filled 2022-01-06 (×13): qty 1

## 2022-01-06 MED ORDER — AMLODIPINE BESYLATE 5 MG PO TABS
10.0000 mg | ORAL_TABLET | Freq: Every day | ORAL | Status: DC
  Administered 2022-01-07 – 2022-01-14 (×8): 10 mg via ORAL
  Filled 2022-01-06 (×8): qty 2

## 2022-01-06 MED ORDER — APIXABAN 5 MG PO TABS
10.0000 mg | ORAL_TABLET | Freq: Two times a day (BID) | ORAL | Status: AC
  Administered 2022-01-07 – 2022-01-13 (×13): 10 mg via ORAL
  Filled 2022-01-06 (×14): qty 2

## 2022-01-06 MED ORDER — ALBUTEROL SULFATE HFA 108 (90 BASE) MCG/ACT IN AERS: 2.0000 | INHALATION_SPRAY | Freq: Four times a day (QID) | RESPIRATORY_TRACT | Status: DC | PRN

## 2022-01-06 MED ORDER — ATORVASTATIN CALCIUM 20 MG PO TABS
80.0000 mg | ORAL_TABLET | Freq: Every day | ORAL | Status: DC
  Administered 2022-01-07 – 2022-01-13 (×7): 80 mg via ORAL
  Filled 2022-01-06 (×7): qty 4

## 2022-01-06 MED ORDER — ACETAMINOPHEN 325 MG PO TABS: 650.0000 mg | ORAL_TABLET | Freq: Three times a day (TID) | ORAL | Status: DC | PRN

## 2022-01-06 MED ORDER — METFORMIN HCL ER 500 MG PO TB24
500.0000 mg | ORAL_TABLET | Freq: Every day | ORAL | Status: DC
  Administered 2022-01-07 – 2022-01-14 (×8): 500 mg via ORAL
  Filled 2022-01-06 (×11): qty 1

## 2022-01-06 MED ORDER — APIXABAN 5 MG PO TABS
5.0000 mg | ORAL_TABLET | Freq: Two times a day (BID) | ORAL | Status: DC
  Administered 2022-01-13 – 2022-01-14 (×2): 5 mg via ORAL
  Filled 2022-01-06 (×2): qty 1

## 2022-01-06 MED ORDER — ASPIRIN 81 MG PO CHEW: 81.0000 mg | CHEWABLE_TABLET | Freq: Every day | ORAL | Status: DC

## 2022-01-06 NOTE — ED Notes (Signed)
Pt transported to CT via stretcher with CT tech. This RN let CT tech know that xray also was looking for pt to do xray.
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
## 2022-01-07 DIAGNOSIS — I82492 Acute embolism and thrombosis of other specified deep vein of left lower extremity: Secondary | ICD-10-CM | POA: Diagnosis not present

## 2022-01-07 LAB — CBG MONITORING, ED
Glucose-Capillary: 242 mg/dL — ABNORMAL HIGH (ref 70–99)
Glucose-Capillary: 173 mg/dL — ABNORMAL HIGH (ref 70–99)
Glucose-Capillary: 139 mg/dL — ABNORMAL HIGH (ref 70–99)
Glucose-Capillary: 164 mg/dL — ABNORMAL HIGH (ref 70–99)

## 2022-01-07 LAB — HEMOGLOBIN A1C
Hgb A1c MFr Bld: 11.4 % — ABNORMAL HIGH (ref 4.8–5.6)
Mean Plasma Glucose: 280.48 mg/dL

## 2022-01-07 NOTE — ED Notes (Signed)
Assisted patient with her dinner tray set-up. Warmed food up and provided diet ginger ale.

## 2022-01-07 NOTE — ED Notes (Signed)
Patient in recliner. Sleeping at this time. Respirations even and unlabored. NAD noted.

## 2022-01-07 NOTE — ED Notes (Signed)
Patient ate approx 95% of her supper. Assisted to the restroom with walker and new brief placed on patient.

## 2022-01-07 NOTE — NC FL2 (Signed)
[place] LEVEL OF CARE SCREENING TOOL     IDENTIFICATION  Patient Name: Tara Sandoval Birthdate: 1941-01-27 Sex: female Admission Date (Current Location): 01/06/2022  [place] and [place] Number:  Selena Lesser [id number] [place] and Address:  [facility], [address], [place], [place] [address]      Provider Number: [id number]  Attending Physician Name and Address:  No att. providers found  Relative Name and Phone Number:       Current Level of Care: SNF Recommended Level of Care: [place] Prior Approval Number:    Date Approved/Denied:   PASRR Number: [id number]  Discharge Plan: SNF    Current Diagnoses: Patient Active Problem List   Diagnosis Date Noted   SOB (shortness of breath) 11/30/2020   Seizure ([place]) 07/06/2020   Hypertensive emergency 07/06/2020   Hyperglycemia due to type 2 diabetes mellitus ([place]) 07/06/2020   History of CVA (cerebrovascular accident) 07/06/2020   CAD (coronary artery disease) 07/06/2020   AF (paroxysmal atrial fibrillation) ([place]) 07/06/2020   Pacemaker 07/06/2020   Hypertensive emergency without congestive heart failure 07/06/2020   OSA (obstructive sleep apnea) 09/04/2015   Breast cancer ([place]) 04/25/2009    Orientation RESPIRATION BLADDER Height & Weight     Self, Time, Situation, Place (moments of confusion-Dementia)  Normal Continent Weight:   Height:  5\' 4"  (162.6 cm)  BEHAVIORAL SYMPTOMS/MOOD NEUROLOGICAL BOWEL NUTRITION STATUS      Continent Diet (Regular)  AMBULATORY STATUS COMMUNICATION OF NEEDS Skin   Limited Assist Verbally Normal                       Personal Care Assistance Level of Assistance  Bathing, Feeding, Dressing Bathing Assistance: Limited assistance Feeding assistance: Limited assistance Dressing Assistance: Limited assistance     Functional Limitations Info             [place]  PT (By licensed PT), OT (By  licensed OT)     PT Frequency: min 5xweek OT Frequency: min 5xweek            Contractures      Additional Factors Info                  Current Medications (01/07/2022):  This is the current hospital active medication list Current Facility-Administered Medications  Medication Dose Route Frequency Provider Last Rate Last Admin   acetaminophen (TYLENOL) tablet 650 mg  650 mg Oral Q8H PRN Triplett, Cari B, FNP       albuterol (PROVENTIL) (2.5 MG/3ML) 0.083% nebulizer solution 2.5 mg  2.5 mg Nebulization Q6H PRN Lucrezia Starch, MD       amLODipine (NORVASC) tablet 10 mg  10 mg Oral Daily Triplett, Cari B, FNP   10 mg at 01/07/22 1023   apixaban (ELIQUIS) tablet 10 mg  10 mg Oral BID Triplett, Cari B, FNP   10 mg at 01/07/22 1022   Followed by   Derrill Memo ON 01/13/2022] apixaban (ELIQUIS) tablet 5 mg  5 mg Oral BID Triplett, Cari B, FNP       atorvastatin (LIPITOR) tablet 80 mg  80 mg Oral QHS Lucrezia Starch, MD       carvedilol (COREG) tablet 25 mg  25 mg Oral BID Triplett, Cari B, FNP       glipiZIDE (GLUCOTROL XL) 24 hr tablet 2.5 mg  2.5 mg Oral Daily Triplett, Cari B, FNP   2.5 mg at 01/07/22 [phone]  hydrALAZINE (APRESOLINE) tablet 25 mg  25 mg Oral Q12H Triplett, Cari B, FNP   25 mg at 01/07/22 1023   insulin aspart (novoLOG) injection 0-15 Units  0-15 Units Subcutaneous TID WC Triplett, Cari B, FNP   5 Units at 01/07/22 0814   insulin glargine-yfgn (SEMGLEE) injection 28 Units  28 Units Subcutaneous BID Triplett, Cari B, FNP   28 Units at 01/07/22 1028   lisinopril (ZESTRIL) tablet 20 mg  20 mg Oral Daily Lucrezia Starch, MD   20 mg at 01/07/22 1023   metFORMIN (GLUCOPHAGE-XR) 24 hr tablet 500 mg  500 mg Oral Daily Triplett, Cari B, FNP   500 mg at 01/07/22 5364   Current Outpatient Medications  Medication Sig Dispense Refill   amLODipine (NORVASC) 10 MG tablet Take 1 tablet (10 mg total) by mouth daily. 90 tablet 1   aspirin 81 MG chewable tablet Chew 81 mg by mouth  daily.     atorvastatin (LIPITOR) 80 MG tablet Take 80 mg by mouth daily.     carvedilol (COREG) 25 MG tablet Take 25 mg by mouth 2 (two) times daily.     glipiZIDE (GLUCOTROL XL) 2.5 MG 24 hr tablet Take 2.5 mg by mouth daily.     hydrALAZINE (APRESOLINE) 25 MG tablet Take 25 mg by mouth in the morning and at bedtime.      insulin aspart (NOVOLOG) 100 UNIT/ML injection Inject 10-16 Units into the skin 3 (three) times daily with meals.     Insulin Glargine (BASAGLAR KWIKPEN) 100 UNIT/ML Inject 28 Units into the skin 2 (two) times daily.     lisinopril (ZESTRIL) 20 MG tablet Take 20 mg by mouth daily.     metFORMIN (GLUCOPHAGE-XR) 500 MG 24 hr tablet Take 500 mg by mouth daily.     acetaminophen (TYLENOL) 650 MG CR tablet Take 650 mg by mouth every 8 (eight) hours as needed for pain.     albuterol (PROVENTIL) (2.5 MG/3ML) 0.083% nebulizer solution Inhale 2.5 mg into the lungs every 4 (four) hours as needed. (Patient not taking: Reported on 11/30/2020)     albuterol (VENTOLIN HFA) 108 (90 Base) MCG/ACT inhaler Inhale 2 puffs into the lungs every 6 (six) hours as needed for wheezing or shortness of breath.     aspirin 81 MG EC tablet Take 81 mg by mouth daily. (Patient not taking: Reported on 11/30/2020)     liraglutide (VICTOZA) 18 MG/3ML SOPN Inject 0.6 mg into the skin as needed. If BS over 600 (Patient not taking: Reported on 01/06/2022)     nitroGLYCERIN (NITROSTAT) 0.4 MG SL tablet Place 0.4 mg under the tongue See admin instructions. Place 1 tablet (0.5 mg) under the tongue every five minutes as needed for chest pain. May take up to three doses. (Patient not taking: Reported on 01/06/2022)     RYBELSUS 7 MG TABS Take 1 tablet by mouth daily. (Patient not taking: Reported on 11/30/2020)       Discharge Medications: Please see discharge summary for a list of discharge medications.  Relevant Imaging Results:  Relevant Lab Results:   Additional Information SS# [id number]  Anselm Pancoast,  RN

## 2022-01-07 NOTE — Evaluation (Signed)
Occupational Therapy Evaluation Patient Details Name: Tara Sandoval MRN: [id number] DOB: September 29, 1941 Today's Date: 01/07/2022   History of Present Illness Pt is an 81 yo female who presented to the emergency department for evaluation of bilateral lower extremity pain. Pt diagnosed with nonocclusive DVT in the proximal left popliteal vein. PMH includes DM, CVA, breast CA, htn, asthma, SSS s/p pacemaker, and CHF.   Clinical Impression   Pt seen for OT evaluation this date in setting of acute hospitalization d/t AMS and LE pain. Pt is poor historian so unsure of PLOF. This date, she requires MIN A for bed mobility and STS with RW. Consistent cueing for safety/sequencing. Pt requires MOD/MAX A for peri care in standing and changing brief. Pt transferred to chair and left with all needs met and in reach. Will continue to follow acutely. Anticipate she will require STR f/u OT services d/t increased need for assist with ADLs/ADL mobility and decreased safety awareness.    Recommendations for follow up therapy are one component of a multi-disciplinary discharge planning process, led by the attending physician.  Recommendations may be updated based on patient status, additional functional criteria and insurance authorization.   Follow Up Recommendations  Skilled nursing-short term rehab (<3 hours/day)    Assistance Recommended at Discharge Frequent or constant Supervision/Assistance  Patient can return home with the following A little help with walking and/or transfers;A lot of help with bathing/dressing/bathroom;Assistance with cooking/housework;Direct supervision/assist for medications management;Direct supervision/assist for financial management;Assist for transportation;Help with stairs or ramp for entrance    Functional Status Assessment  Patient has had a recent decline in their functional status and demonstrates the ability to make significant improvements in function in a reasonable and predictable  amount of time.  Equipment Recommendations  BSC/3in1;Tub/shower seat;Other (comment) (rw)    Recommendations for Other Services       Precautions / Restrictions Precautions Precautions: Fall Restrictions Weight Bearing Restrictions: No      Mobility Bed Mobility Overal bed mobility: Needs Assistance Bed Mobility: Supine to Sit     Supine to sit: Min assist     General bed mobility comments: NT, pt in recliner    Transfers Overall transfer level: Needs assistance Equipment used: Rolling walker (2 wheels) Transfers: Sit to/from Stand Sit to Stand: Min assist           General transfer comment: cues for safety.      Balance Overall balance assessment: Needs assistance Sitting-balance support: Bilateral upper extremity supported;Feet supported Sitting balance-Leahy Scale: Good     Standing balance support: Bilateral upper extremity supported;During functional activity Standing balance-Leahy Scale: Fair                             ADL either performed or assessed with clinical judgement   ADL                                         General ADL Comments: requires SETUP for UB ADLs, MOD A for LB ADLs, MIN A with RW for ADL transfers.     Vision Patient Visual Report: No change from baseline       Perception     Praxis      Pertinent Vitals/Pain Pain Assessment: Faces Faces Pain Scale: Hurts a little bit Pain Location: legs generally, but difficulty quantifying/qualifying Pain Descriptors / Indicators: Grimacing;Squeezing Pain  Intervention(s): Limited activity within patient's tolerance;Monitored during session;Repositioned     Hand Dominance     Extremity/Trunk Assessment Upper Extremity Assessment Upper Extremity Assessment: Generalized weakness   Lower Extremity Assessment Lower Extremity Assessment: Generalized weakness       Communication Communication Communication: No difficulties   Cognition  Arousal/Alertness: Awake/alert Behavior During Therapy: WFL for tasks assessed/performed Overall Cognitive Status: No family/caregiver present to determine baseline cognitive functioning                                 General Comments: Pt unable to provide history, alert to self, year, and that she was at a "hospital" but not which one or the city; able to follow simple 1-step commands with extra time and cuing     General Comments       Exercises Total Joint Exercises Ankle Circles/Pumps: Strengthening;Both;10 reps;5 reps (with manual resistance) Quad Sets: Strengthening;Both;10 reps Hip ABduction/ADduction: Strengthening;Both;10 reps;AAROM Straight Leg Raises: Strengthening;Both;10 reps;AAROM Long Arc Quad: AROM;Strengthening;Both;10 reps Other Exercises Other Exercises: OT ed with pt re: role, safety, fall prevention, safe use of RW. Pt with poor carryover of new learning   Shoulder Instructions      Home Living                                   Additional Comments: Pt unable to provide history/PLOF secondary to deficits in cognition, no family available to assist      Prior Functioning/Environment Prior Level of Function : Patient poor historian/Family not available                        OT Problem List: Decreased strength;Decreased activity tolerance;Decreased safety awareness      OT Treatment/Interventions: Self-care/ADL training;Therapeutic exercise;DME and/or AE instruction;Therapeutic activities;Balance training;Patient/family education    OT Goals(Current goals can be found in the care plan section) Acute Rehab OT Goals Patient Stated Goal: none stated OT Goal Formulation: Patient unable to participate in goal setting Time For Goal Achievement: 01/21/22 Potential to Achieve Goals: Fair  OT Frequency: Min 2X/week    Co-evaluation              AM-PAC OT "6 Clicks" Daily Activity     Outcome Measure Help from  another person eating meals?: None Help from another person taking care of personal grooming?: A Little Help from another person toileting, which includes using toliet, bedpan, or urinal?: A Lot Help from another person bathing (including washing, rinsing, drying)?: A Lot Help from another person to put on and taking off regular upper body clothing?: A Little Help from another person to put on and taking off regular lower body clothing?: A Lot 6 Click Score: 16   End of Session Equipment Utilized During Treatment: Gait belt;Rolling walker (2 wheels) Nurse Communication: Mobility status  Activity Tolerance: Patient tolerated treatment well Patient left: in chair;with call bell/phone within reach  OT Visit Diagnosis: Unsteadiness on feet (R26.81);Muscle weakness (generalized) (M62.81);History of falling (Z91.81);Other symptoms and signs involving cognitive function                Time: 0254-2706 OT Time Calculation (min): 32 min Charges:  OT General Charges $OT Visit: 1 Visit OT Evaluation $OT Eval Moderate Complexity: 1 Mod OT Treatments $Self Care/Home Management : 8-22 mins  Gerrianne Scale, MS, OTR/L ascom [phone] 01/07/22,  4:55 PM

## 2022-01-07 NOTE — ED Notes (Signed)
Report received from Lindsay, RN

## 2022-01-07 NOTE — ED Notes (Signed)
Family at bedside. New warm blankets provided.

## 2022-01-07 NOTE — Evaluation (Signed)
Physical Therapy Evaluation Patient Details Name: Tara Sandoval MRN: [id number] DOB: 12/05/1941 Today's Date: 01/07/2022  History of Present Illness  Pt is an 81 yo female who presented to the emergency department for evaluation of bilateral lower extremity pain. Pt diagnosed with nonocclusive DVT in the proximal left popliteal vein. PMH includes DM, CVA, breast CA, htn, asthma, SSS s/p pacemaker, and CHF.   Clinical Impression  Pt pleasant but confused during the session and was unable to provide history or PLOF. Pt was able to follow simple 1-step commands consistently with extra time and cuing.  Pt attempted to stand from her recliner with her hands on the RW handles and was unable to do so. With hands on the recliner arm rests the pt was able to come to standing with min A.  Pt was generally steady during amb 2 x 10' with a RW but ambulated with a slow, cautious cadence with lean on the RW for support and with standing rest break taken between 10' walks.  Although PLOF is unknown the pt does present with significant deficits in functional strength and is at a high risk for falls. Pt will benefit from PT services in a SNF setting upon discharge to safely address deficits listed in patient problem list for decreased caregiver assistance and eventual return to PLOF.        Recommendations for follow up therapy are one component of a multi-disciplinary discharge planning process, led by the attending physician.  Recommendations may be updated based on patient status, additional functional criteria and insurance authorization.  Follow Up Recommendations Skilled nursing-short term rehab (<3 hours/day)    Assistance Recommended at Discharge Frequent or constant Supervision/Assistance  Patient can return home with the following  A lot of help with walking and/or transfers;A lot of help with bathing/dressing/bathroom;Direct supervision/assist for medications management;Assistance with  cooking/housework;Help with stairs or ramp for entrance;Assist for transportation;Direct supervision/assist for financial management    Equipment Recommendations Other (comment) (Pt access to DME unkown, would benefit from RW)  Recommendations for Other Services       Functional Status Assessment Patient has had a recent decline in their functional status and demonstrates the ability to make significant improvements in function in a reasonable and predictable amount of time.     Precautions / Restrictions Precautions Precautions: Fall Restrictions Weight Bearing Restrictions: No      Mobility  Bed Mobility               General bed mobility comments: NT, pt in recliner    Transfers Overall transfer level: Needs assistance Equipment used: Rolling walker (2 wheels) Transfers: Sit to/from Stand Sit to Stand: Min assist           General transfer comment: Mod verbal cues for hand placement; pt unable to stand with hands on RW but able to stand with min A with hands on arm rests    Ambulation/Gait Ambulation/Gait assistance: Min guard Gait Distance (Feet): 10 Feet x 2 Assistive device: Rolling walker (2 wheels) Gait Pattern/deviations: Step-through pattern;Decreased step length - right;Decreased step length - left Gait velocity: decreased     General Gait Details: Very slow cadence but no buckling or LOB during amb; pt able to amb 2 x 10' with short standing rest break between walks  Stairs            Wheelchair Mobility    Modified Rankin (Stroke Patients Only)       Balance Overall balance assessment: Needs assistance Sitting-balance  support: Bilateral upper extremity supported;Feet supported Sitting balance-Leahy Scale: Good     Standing balance support: Bilateral upper extremity supported;During functional activity Standing balance-Leahy Scale: Fair                               Pertinent Vitals/Pain Pain Assessment: No/denies  pain    Home Living                     Additional Comments: Pt unable to provide history/PLOF secondary to deficits in cognition, no family available to assist    Prior Function                       Hand Dominance        Extremity/Trunk Assessment   Upper Extremity Assessment Upper Extremity Assessment: Generalized weakness    Lower Extremity Assessment Lower Extremity Assessment: Generalized weakness       Communication   Communication: No difficulties  Cognition Arousal/Alertness: Awake/alert Behavior During Therapy: WFL for tasks assessed/performed Overall Cognitive Status: No family/caregiver present to determine baseline cognitive functioning                                 General Comments: Pt unable to provide history, alert to self, year, and that she was at a "hospital" but not which one or the city; able to follow simple 1-step commands with extra time and cuing        General Comments      Exercises Total Joint Exercises Ankle Circles/Pumps: Strengthening;Both;10 reps;5 reps (with manual resistance) Quad Sets: Strengthening;Both;10 reps Hip ABduction/ADduction: Strengthening;Both;10 reps;AAROM Straight Leg Raises: Strengthening;Both;10 reps;AAROM Long Arc Quad: AROM;Strengthening;Both;10 reps   Assessment/Plan    PT Assessment Patient needs continued PT services  PT Problem List Decreased strength;Decreased balance;Decreased knowledge of use of DME;Decreased mobility;Decreased activity tolerance       PT Treatment Interventions DME instruction;Gait training;Functional mobility training;Therapeutic activities;Stair training;Therapeutic exercise;Balance training;Patient/family education    PT Goals (Current goals can be found in the Care Plan section)  Acute Rehab PT Goals PT Goal Formulation: Patient unable to participate in goal setting Time For Goal Achievement: 01/20/22 Potential to Achieve Goals: Fair     Frequency Min 2X/week     Co-evaluation               AM-PAC PT "6 Clicks" Mobility  Outcome Measure Help needed turning from your back to your side while in a flat bed without using bedrails?: A Little Help needed moving from lying on your back to sitting on the side of a flat bed without using bedrails?: A Little Help needed moving to and from a bed to a chair (including a wheelchair)?: A Little Help needed standing up from a chair using your arms (e.g., wheelchair or bedside chair)?: A Little Help needed to walk in hospital room?: A Little Help needed climbing 3-5 steps with a railing? : Total 6 Click Score: 16    End of Session Equipment Utilized During Treatment: Gait belt Activity Tolerance: Patient tolerated treatment well Patient left: in chair;Other (comment) (Pt in ED hallway next to nsg desk at end of session as found) Nurse Communication: Mobility status PT Visit Diagnosis: History of falling (Z91.81);Muscle weakness (generalized) (M62.81);Difficulty in walking, not elsewhere classified (R26.2)    Time: 1779-3903 PT Time Calculation (min) (ACUTE ONLY): 17 min  Charges:   PT Evaluation $PT Eval Moderate Complexity: 1 Mod         D. Scott Maitlyn Penza PT, DPT 01/07/22, 1:50 PM

## 2022-01-07 NOTE — TOC Initial Note (Signed)
Transition of Care [facility]) - Initial/Assessment Note    Patient Details  Name: Tara Sandoval MRN: [id number] Date of Birth: June 02, 1941  [facility] CM/SW Contact:    Anselm Pancoast, RN Phone Number: 01/07/2022, 10:51 AM  Clinical Narrative:                 Spoke with daughter, August who reports patient lives with her and they had recently moved into new area which had caused some increased confusion with her dementia. Daughter is hoping patient can go to short term rehab and then return to home with daughter. Patient had been independent with ADL's with the occasional need for assistance due to dementia. Previously had spent some time in [place] for rehab due to daughter working there however daughter is now home caring for patient.   Expected Discharge Plan: Skilled Nursing Facility Barriers to Discharge: No Barriers Identified   Patient Goals and CMS Choice Patient states their goals for this hospitalization and ongoing recovery are:: Get strength back and return home with dtr CMS Medicare.gov Compare Post Acute Care list provided to:: Patient Represenative (must comment) Choice offered to / list presented to : Adult Children  Expected Discharge Plan and Services Expected Discharge Plan: [place] arrangements for the past 2 months: Single Family Home                                      Prior Living Arrangements/Services Living arrangements for the past 2 months: Single Family Home Lives with:: Adult Children Patient language and need for interpreter reviewed:: Yes Do you feel safe going back to the place where you live?: Yes      Need for Family Participation in Patient Care: Yes (Comment) Care giver support system in place?: Yes (comment)   Criminal Activity/Legal Involvement Pertinent to Current Situation/Hospitalization: No - Comment as needed  Activities of Daily Living      Permission Sought/Granted Permission  sought to share information with : Family Supports Permission granted to share information with : Yes, Verbal Permission Granted  Share Information with NAME: Dtr-August Phillip Heal           Emotional Assessment Appearance:: Appears stated age Attitude/Demeanor/Rapport: Unable to Assess Affect (typically observed): Unable to Assess Orientation: : Oriented to Self, Oriented to Place, Oriented to Situation Alcohol / Substance Use: Not Applicable Psych Involvement: No (comment)  Admission diagnosis:  Leg pain, EMS Patient Active Problem List   Diagnosis Date Noted   SOB (shortness of breath) 11/30/2020   Seizure ([place]) 07/06/2020   Hypertensive emergency 07/06/2020   Hyperglycemia due to type 2 diabetes mellitus ([place]) 07/06/2020   History of CVA (cerebrovascular accident) 07/06/2020   CAD (coronary artery disease) 07/06/2020   AF (paroxysmal atrial fibrillation) ([place]) 07/06/2020   Pacemaker 07/06/2020   Hypertensive emergency without congestive heart failure 07/06/2020   OSA (obstructive sleep apnea) 09/04/2015   Breast cancer ([place]) 04/25/2009   PCP:  System, Provider Not In Pharmacy:   [id number] - [place], [place] - [address] [place] [address] Phone: [phone] Fax: [phone]     Social Determinants of Health (SDOH) Interventions    Readmission Risk Interventions No flowsheet data found.

## 2022-01-07 NOTE — ED Notes (Signed)
Pt sheets and brief changed at this time.

## 2022-01-07 NOTE — ED Notes (Signed)
Patient's lunch tray at bedside.

## 2022-01-08 DIAGNOSIS — I82492 Acute embolism and thrombosis of other specified deep vein of left lower extremity: Secondary | ICD-10-CM | POA: Diagnosis not present

## 2022-01-08 LAB — CBG MONITORING, ED
Glucose-Capillary: 160 mg/dL — ABNORMAL HIGH (ref 70–99)
Glucose-Capillary: 188 mg/dL — ABNORMAL HIGH (ref 70–99)
Glucose-Capillary: 118 mg/dL — ABNORMAL HIGH (ref 70–99)
Glucose-Capillary: 168 mg/dL — ABNORMAL HIGH (ref 70–99)

## 2022-01-08 NOTE — ED Notes (Signed)
Ambulated pt to bathroom 1 assist with walker. Pt instructed to pull red cord when ready.

## 2022-01-08 NOTE — ED Provider Notes (Signed)
Today's Vitals   01/07/22 2313 01/08/22 0010 01/08/22 0120 01/08/22 0142  BP:    138/64  Pulse:    63  Resp:    15  Temp:      TempSrc:      SpO2:    97%  Height:      PainSc: Asleep Asleep Asleep Asleep   Body mass index is 29.18 kg/m.   Patient resting comfortably.  Denies any acute complaints.  States "I am getting the best care".  Awaiting social work disposition.   Demon Volante, Delice Bison, DO 01/08/22 [id number]

## 2022-01-08 NOTE — ED Notes (Signed)
Pt resting with eyes closed, respirations even and unlabored.

## 2022-01-08 NOTE — ED Notes (Signed)
Assisted patient back into the bed. Siderails up x 2, bed locked and in low position.

## 2022-01-08 NOTE — ED Notes (Signed)
Pt given warm blankets per her request.  Pt alert at this time and encouraged to let nursing staff know of any needs.

## 2022-01-08 NOTE — ED Notes (Signed)
Dietary called to send pt hot tray

## 2022-01-08 NOTE — ED Notes (Signed)
Pt oob to BR 

## 2022-01-09 DIAGNOSIS — I82492 Acute embolism and thrombosis of other specified deep vein of left lower extremity: Secondary | ICD-10-CM | POA: Diagnosis not present

## 2022-01-09 LAB — CBG MONITORING, ED
Glucose-Capillary: 89 mg/dL (ref 70–99)
Glucose-Capillary: 146 mg/dL — ABNORMAL HIGH (ref 70–99)
Glucose-Capillary: 164 mg/dL — ABNORMAL HIGH (ref 70–99)
Glucose-Capillary: 140 mg/dL — ABNORMAL HIGH (ref 70–99)

## 2022-01-09 NOTE — ED Notes (Signed)
Pt resting with eyes closed, respirations even and unlabored at this time.

## 2022-01-09 NOTE — Progress Notes (Signed)
CSW contacted [facility] who can take patient tomorrow. TOC will need to contact Tammy with [facility] in the morning.

## 2022-01-09 NOTE — ED Notes (Signed)
Pt given warm blanket, diet gingerale and sandwich tray at this time per her request.

## 2022-01-09 NOTE — ED Notes (Signed)
Pt resting with eyes closed, respirations even and unlabored at this time,

## 2022-01-09 NOTE — Progress Notes (Signed)
CSW contacted patients daughter to let her know about the bed offer at [facility]. CSW left a message.

## 2022-01-09 NOTE — ED Notes (Signed)
Pt given crayons and paper.

## 2022-01-09 NOTE — ED Notes (Signed)
This RN assisted pt to clean hands and under fingernails. Pt states that she has trouble cleaning under finger nails after toileting.

## 2022-01-09 NOTE — ED Notes (Signed)
This RN assisted pt with pericare, clean gown, and brief. Pt assisted with ambulation back to chair, tolerated well.  No other needs or requests expressed at this time.

## 2022-01-09 NOTE — ED Notes (Signed)
Pt resting in bed alert with no needs or requests at this time.  Pt encouraged to let RN know of any needs.

## 2022-01-10 DIAGNOSIS — I82492 Acute embolism and thrombosis of other specified deep vein of left lower extremity: Secondary | ICD-10-CM | POA: Diagnosis not present

## 2022-01-10 LAB — RESP PANEL BY RT-PCR (FLU A&B, COVID) ARPGX2
Influenza A by PCR: NEGATIVE
Influenza B by PCR: NEGATIVE
SARS Coronavirus 2 by RT PCR: POSITIVE — AB

## 2022-01-10 LAB — CBG MONITORING, ED
Glucose-Capillary: 125 mg/dL — ABNORMAL HIGH (ref 70–99)
Glucose-Capillary: 168 mg/dL — ABNORMAL HIGH (ref 70–99)
Glucose-Capillary: 77 mg/dL (ref 70–99)

## 2022-01-10 NOTE — ED Notes (Signed)
Pt assisted to restroom using walker. Gown changed. Brief changed. Linens changed. Assisted to recliner. Provided coffee as requested. Warm blankets provided.  Denies needs

## 2022-01-10 NOTE — ED Notes (Signed)
Pt assisted to restroom with walker.  Eating breakfast at this time

## 2022-01-10 NOTE — TOC Progression Note (Signed)
Transition of Care [facility]) - Progression Note    Patient Details  Name: Tara Sandoval MRN: [id number] Date of Birth: 1941-06-12  [facility]) CM/SW Contact  Shelbie Hutching, RN Phone Number: 01/10/2022, 12:50 PM  Clinical Narrative:    Patient was supposed to go to [facility] today for short term rehab but her COVID test came back positive today so [facility] would not be able to accept for 10 days.  Patient is not having any symptoms.  Patient reports that she wants to go home.  Daughter updated that patient COVID positive.  She is considering coming to pick her up and take her home.  Daughter, August will call back after she has discussed with her husband.    Expected Discharge Plan: [place] Barriers to Discharge: No Barriers Identified  Expected Discharge Plan and Services Expected Discharge Plan: [place] arrangements for the past 2 months: Single Family Home                                       Social Determinants of Health (SDOH) Interventions    Readmission Risk Interventions No flowsheet data found.

## 2022-01-10 NOTE — ED Notes (Signed)
Pt assisted to restroom after BM incontinence. Pad on chair changed. Gown changed. Brief changed. Peri care provided.

## 2022-01-10 NOTE — ED Notes (Signed)
Dr Ellender Hose aware of COVID +. No new orders at this time.  No complaints noted. NAD noted.  Resting with RR Even and unlabored

## 2022-01-10 NOTE — ED Notes (Signed)
Pt provided juice.

## 2022-01-10 NOTE — ED Provider Notes (Signed)
Emergency Medicine Observation Re-evaluation Note  Tara Sandoval is a 81 y.o. female, seen on rounds today.  Pt initially presented to the ED for complaints of Leg Pain Currently, the patient is resting w/o acute complaints.  Physical Exam  BP 139/74 (BP Location: Right Arm)    Pulse 63    Temp 98.3 F (36.8 C) (Oral)    Resp 16    Ht 5\' 4"  (1.626 m)    SpO2 95%    BMI 29.18 kg/m  Physical Exam General: no acute distress Psych: calm  ED Course / MDM  EKG:   I have reviewed the labs performed to date as well as medications administered while in observation.  Recent changes in the last 24 hours include none.  Plan  Current plan is for social work to [place].  HAYLA HINGER is not under involuntary commitment.     Lucrezia Starch, MD 01/10/22 [phone]

## 2022-01-11 DIAGNOSIS — I82492 Acute embolism and thrombosis of other specified deep vein of left lower extremity: Secondary | ICD-10-CM | POA: Diagnosis not present

## 2022-01-11 LAB — CBG MONITORING, ED
Glucose-Capillary: 193 mg/dL — ABNORMAL HIGH (ref 70–99)
Glucose-Capillary: 105 mg/dL — ABNORMAL HIGH (ref 70–99)
Glucose-Capillary: 169 mg/dL — ABNORMAL HIGH (ref 70–99)
Glucose-Capillary: 156 mg/dL — ABNORMAL HIGH (ref 70–99)
Glucose-Capillary: 177 mg/dL — ABNORMAL HIGH (ref 70–99)

## 2022-01-11 NOTE — ED Provider Notes (Signed)
----------------------------------------- °  5:21 AM on 01/11/2022 -----------------------------------------   Blood pressure 131/68, pulse 61, temperature 98.3 F (36.8 C), temperature source Oral, resp. rate 20, height 5\' 4"  (1.626 m), SpO2 94 %.  The patient is calm and cooperative at this time.  There have been no acute events since the last update.  Awaiting disposition plan from social worker   Paulette Blanch, MD 01/11/22 [phone]

## 2022-01-11 NOTE — TOC Progression Note (Signed)
Transition of Care [facility]) - Progression Note    Patient Details  Name: Tara Sandoval MRN: [id number] Date of Birth: 03-20-1941  Transition of Care [facility]) CM/SW Contact  Shelbie Hutching, RN Phone Number: 01/11/2022, 12:50 PM  Clinical Narrative:    Message left for patient's daughter for return call.  Daughter never called back yesterday.     Expected Discharge Plan: [place] Barriers to Discharge: No Barriers Identified  Expected Discharge Plan and Services Expected Discharge Plan: [place] arrangements for the past 2 months: Single Family Home                                       Social Determinants of Health (SDOH) Interventions    Readmission Risk Interventions No flowsheet data found.

## 2022-01-11 NOTE — ED Notes (Signed)
Breakfast tray given to pt 

## 2022-01-11 NOTE — Progress Notes (Signed)
Occupational Therapy Treatment Patient Details Name: Tara Sandoval MRN: [id number] DOB: 1941-04-14 Today's Date: 01/11/2022   History of present illness Pt is an 81 yo female who presented to the emergency department for evaluation of bilateral lower extremity pain. Pt diagnosed with nonocclusive DVT in the proximal left popliteal vein. PMH includes DM, CVA, breast CA, htn, asthma, SSS s/p pacemaker, and CHF.   OT comments  Pt seated in recliner chair and sleeping soundly. She was agreeable to OT intervention. Pt scooting forward in recliner chair and demonstrates ability to don/doff socks with close supervision. Pt standing from recliner chair with min guard and use of RW. Pt ambulates with min guard progressing to close supervision for ~ 50'. Pt also cued to side step with RW in tight space around bed. Pt reports fatigue with activity but motivated to return home. Pt staying in recliner chair per her request. All needs within reach. OT updated recommendation to home health at discharge.    Recommendations for follow up therapy are one component of a multi-disciplinary discharge planning process, led by the attending physician.  Recommendations may be updated based on patient status, additional functional criteria and insurance authorization.    Follow Up Recommendations  Home health OT    Assistance Recommended at Discharge Frequent or constant Supervision/Assistance  Patient can return home with the following  A little help with walking and/or transfers;Assistance with cooking/housework;Direct supervision/assist for medications management;Direct supervision/assist for financial management;Assist for transportation;Help with stairs or ramp for entrance;A little help with bathing/dressing/bathroom   Equipment Recommendations  BSC/3in1;Tub/shower seat;Other (comment) (RW)       Precautions / Restrictions Precautions Precautions: Fall Restrictions Weight Bearing Restrictions: No        Mobility Bed Mobility               General bed mobility comments: seated in recliner chair    Transfers Overall transfer level: Needs assistance Equipment used: Rolling walker (2 wheels) Transfers: Sit to/from Stand Sit to Stand: Min guard                 Balance Overall balance assessment: Needs assistance Sitting-balance support: Bilateral upper extremity supported, Feet supported Sitting balance-Leahy Scale: Good     Standing balance support: Bilateral upper extremity supported, During functional activity Standing balance-Leahy Scale: Fair Standing balance comment: min guard progressing to close supervision with RW                           ADL either performed or assessed with clinical judgement   ADL Overall ADL's : Needs assistance/impaired                     Lower Body Dressing: Supervision/safety;Sitting/lateral leans Lower Body Dressing Details (indicate cue type and reason): supervision to don/doff socks while seated on edge of recliner chair                    Extremity/Trunk Assessment Upper Extremity Assessment Upper Extremity Assessment: Generalized weakness   Lower Extremity Assessment Lower Extremity Assessment: Generalized weakness        Vision Patient Visual Report: No change from baseline            Cognition Arousal/Alertness: Awake/alert Behavior During Therapy: WFL for tasks assessed/performed Overall Cognitive Status: No family/caregiver present to determine baseline cognitive functioning  General Comments: Pt is pleasant and cooperative. Pt needing increased time to follow 1 step commands but is pleasant overall.                   Pertinent Vitals/ Pain       Pain Assessment Pain Assessment: No/denies pain         Frequency  Min 2X/week        Progress Toward Goals  OT Goals(current goals can now be found in the care plan  section)  Progress towards OT goals: Progressing toward goals  Acute Rehab OT Goals Patient Stated Goal: to go home OT Goal Formulation: With patient Time For Goal Achievement: 01/21/22 Potential to Achieve Goals: [place] Frequency remains appropriate;Discharge plan needs to be updated       AM-PAC OT "6 Clicks" Daily Activity     Outcome Measure   Help from another person eating meals?: None Help from another person taking care of personal grooming?: A Little Help from another person toileting, which includes using toliet, bedpan, or urinal?: A Little Help from another person bathing (including washing, rinsing, drying)?: A Little Help from another person to put on and taking off regular upper body clothing?: A Little Help from another person to put on and taking off regular lower body clothing?: A Little 6 Click Score: 19    End of Session Equipment Utilized During Treatment: Rolling walker (2 wheels)  OT Visit Diagnosis: Unsteadiness on feet (R26.81);Muscle weakness (generalized) (M62.81);History of falling (Z91.81);Other symptoms and signs involving cognitive function   Activity Tolerance Patient tolerated treatment well   Patient Left in chair;with call bell/phone within reach   Nurse Communication Mobility status        Time: 9628-3662 OT Time Calculation (min): 24 min  Charges: OT General Charges $OT Visit: 1 Visit OT Treatments $Self Care/Home Management : 8-22 mins $Therapeutic Activity: 8-22 mins  Darleen Crocker, MS, OTR/L , CBIS ascom [phone]  01/11/22, 4:13 PM

## 2022-01-11 NOTE — ED Notes (Signed)
Pt resting comfortably at this time. NAD noted. Normal rise and fall of chest. NAD noted.

## 2022-01-11 NOTE — Progress Notes (Signed)
Physical Therapy Treatment Patient Details Name: Tara Sandoval MRN: [id number] DOB: 1941/08/21 Today's Date: 01/11/2022   History of Present Illness Pt is an 81 yo female who presented to the emergency department for evaluation of bilateral lower extremity pain. Pt diagnosed with nonocclusive DVT in the proximal left popliteal vein. PMH includes DM, CVA, breast CA, htn, asthma, SSS s/p pacemaker, and CHF.    PT Comments    Pt was pleasant and motivated to participate during the session and put forth good effort throughout. Pt required only extra time, effort, and use of rails during sup to sit this session. Pt was unable to stand from the bed in low position but was able to stand with bed slightly elevated with min A for stability.  Once up pt was steady with no more instances of LOB with RW support and was able to amb 30 feet with no adverse symptoms.  Pt will benefit from PT services in a SNF setting upon discharge to safely address deficits listed in patient problem list for decreased caregiver assistance and eventual return to PLOF.     Recommendations for follow up therapy are one component of a multi-disciplinary discharge planning process, led by the attending physician.  Recommendations may be updated based on patient status, additional functional criteria and insurance authorization.  Follow Up Recommendations  Skilled nursing-short term rehab (<3 hours/day)     Assistance Recommended at Discharge Frequent or constant Supervision/Assistance  Patient can return home with the following Direct supervision/assist for medications management;Assistance with cooking/housework;Help with stairs or ramp for entrance;Assist for transportation;Direct supervision/assist for financial management;A little help with walking and/or transfers;A little help with bathing/dressing/bathroom   Equipment Recommendations  Other (comment) (Access to DME unkown, would benefit from a RW)    Recommendations for  Other Services       Precautions / Restrictions Precautions Precautions: Fall Restrictions Weight Bearing Restrictions: No     Mobility  Bed Mobility Overal bed mobility: Needs Assistance Bed Mobility: Supine to Sit     Supine to sit: Supervision     General bed mobility comments: Cues for sequencing with extra time, effort, and use of rail required    Transfers Overall transfer level: Needs assistance Equipment used: Rolling walker (2 wheels) Transfers: Sit to/from Stand Sit to Stand: Min assist, From elevated surface           General transfer comment: Pt unable to stand from bed in standard position and with bed elevated pt required min A to prevent posterior LOB upon coming to initial standing position    Ambulation/Gait Ambulation/Gait assistance: Min guard Gait Distance (Feet): 30 Feet Assistive device: Rolling walker (2 wheels) Gait Pattern/deviations: Step-through pattern, Decreased step length - right, Decreased step length - left Gait velocity: decreased     General Gait Details: Very slow cadence but no buckling or LOB during amb; Min verbal cues for amb closer to [employer]    Modified Rankin (Stroke Patients Only)       Balance Overall balance assessment: Needs assistance Sitting-balance support: Bilateral upper extremity supported, Feet supported Sitting balance-Leahy Scale: Good     Standing balance support: Bilateral upper extremity supported, During functional activity Standing balance-Leahy Scale: Poor Standing balance comment: Min A to prevent posterior LOB upon initial stand  Cognition Arousal/Alertness: Awake/alert Behavior During Therapy: WFL for tasks assessed/performed Overall Cognitive Status: No family/caregiver present to determine baseline cognitive functioning                                          Exercises Total Joint  Exercises Ankle Circles/Pumps: AROM, Strengthening, Both, 5 reps, 10 reps (with manual resistance) Quad Sets: Strengthening, Both, 10 reps Hip ABduction/ADduction: Strengthening, Both, 10 reps Straight Leg Raises: Strengthening, Both, 10 reps Marching in Standing: AROM, Strengthening, Both, 5 reps    General Comments        Pertinent Vitals/Pain Pain Assessment Pain Assessment: No/denies pain    Home Living                          Prior Function            PT Goals (current goals can now be found in the care plan section) Progress towards PT goals: Progressing toward goals    Frequency    Min 2X/week      PT Plan Current plan remains appropriate    Co-evaluation              AM-PAC PT "6 Clicks" Mobility   Outcome Measure  Help needed turning from your back to your side while in a flat bed without using bedrails?: A Little Help needed moving from lying on your back to sitting on the side of a flat bed without using bedrails?: A Little Help needed moving to and from a bed to a chair (including a wheelchair)?: A Little Help needed standing up from a chair using your arms (e.g., wheelchair or bedside chair)?: A Little Help needed to walk in hospital room?: A Little Help needed climbing 3-5 steps with a railing? : A Lot 6 Click Score: 17    End of Session Equipment Utilized During Treatment: Gait belt Activity Tolerance: Patient tolerated treatment well Patient left: in chair Nurse Communication: Mobility status;Other (comment) (Pt in recliner in ED hallway, nursing notified) PT Visit Diagnosis: History of falling (Z91.81);Muscle weakness (generalized) (M62.81);Difficulty in walking, not elsewhere classified (R26.2)     Time: 8675-4492 PT Time Calculation (min) (ACUTE ONLY): 25 min  Charges:  $Gait Training: 8-22 mins $Therapeutic Exercise: 8-22 mins                     D. Scott Rowen Hur PT, DPT 01/11/22, 2:42 PM

## 2022-01-12 DIAGNOSIS — I82492 Acute embolism and thrombosis of other specified deep vein of left lower extremity: Secondary | ICD-10-CM | POA: Diagnosis not present

## 2022-01-12 LAB — CBG MONITORING, ED
Glucose-Capillary: 117 mg/dL — ABNORMAL HIGH (ref 70–99)
Glucose-Capillary: 155 mg/dL — ABNORMAL HIGH (ref 70–99)
Glucose-Capillary: 174 mg/dL — ABNORMAL HIGH (ref 70–99)
Glucose-Capillary: 129 mg/dL — ABNORMAL HIGH (ref 70–99)

## 2022-01-12 MED ORDER — TRAMADOL HCL 50 MG PO TABS
50.0000 mg | ORAL_TABLET | Freq: Once | ORAL | Status: AC
  Administered 2022-01-12: 50 mg via ORAL
  Filled 2022-01-12: qty 1

## 2022-01-12 NOTE — Progress Notes (Signed)
Occupational Therapy Treatment Patient Details Name: Tara Sandoval MRN: [id number] DOB: [birth year] Today's Date: 01/12/2022   History of present illness Pt is an 81 yo female who presented to the emergency department for evaluation of bilateral lower extremity pain. Pt diagnosed with nonocclusive DVT in the proximal left popliteal vein. PMH includes DM, CVA, breast CA, htn, asthma, SSS s/p pacemaker, and CHF.   OT comments  Upon arrival, pt. was asleep leaning excessively to the right over the side of the chair. Pt. was easily awakened, and was assisted with repositioned to midline for safety. Pt. performed STS transfers with Min guardA from the chair. Pt. was assisted with the meal tray position, and was left with chair in the reclined position with multiple blankets. Pt. continues to benefit from OT services for ADL training, A/E training, and pt./caregiver education about home modification, and DME.    Recommendations for follow up therapy are one component of a multi-disciplinary discharge planning process, led by the attending physician.  Recommendations may be updated based on patient status, additional functional criteria and insurance authorization.    Follow Up Recommendations  Home health OT    Assistance Recommended at Discharge Frequent or constant Supervision/Assistance  Patient can return home with the following  A little help with walking and/or transfers;Assistance with cooking/housework;Direct supervision/assist for medications management;Direct supervision/assist for financial management;Assist for transportation;Help with stairs or ramp for entrance;A little help with bathing/dressing/bathroom   Equipment Recommendations       Recommendations for Other Services      Precautions / Restrictions Precautions Precautions: Fall Restrictions Weight Bearing Restrictions: No       Mobility Bed Mobility               General bed mobility comments: Pt. in recliner upon  arrival.    Transfers Overall transfer level: Needs assistance Equipment used: Rolling walker (2 wheels) Transfers: Sit to/from Stand Sit to Stand: Min guard                 Balance                                           ADL either performed or assessed with clinical judgement   ADL                                       Functional mobility during ADLs: Min guard      Extremity/Trunk Assessment Upper Extremity Assessment Upper Extremity Assessment: Generalized weakness            Vision Patient Visual Report: No change from baseline     Perception     Praxis      Cognition Arousal/Alertness: Awake/alert Behavior During Therapy: WFL for tasks assessed/performed Overall Cognitive Status: No family/caregiver present to determine baseline cognitive functioning                                          Exercises      Shoulder Instructions       General Comments      Pertinent Vitals/ Pain       Pain Assessment Pain Assessment: 0-10 Faces Pain Scale: Hurts a little bit Pain  Location: legs Pain Descriptors / Indicators: Sore Pain Intervention(s): Limited activity within patient's tolerance, Monitored during session  Home Living                                          Prior Functioning/Environment              Frequency  Min 2X/week        Progress Toward Goals  OT Goals(current goals can now be found in the care plan section)  Progress towards OT goals: Progressing toward goals  Acute Rehab OT Goals Patient Stated Goal: To go home OT Goal Formulation: With patient Time For Goal Achievement: 01/21/22 Potential to Achieve Goals: [place] Frequency remains appropriate;Discharge plan needs to be updated    Co-evaluation                 AM-PAC OT "6 Clicks" Daily Activity     Outcome Measure   Help from another person eating meals?: None Help from  another person taking care of personal grooming?: A Little Help from another person toileting, which includes using toliet, bedpan, or urinal?: A Little Help from another person bathing (including washing, rinsing, drying)?: A Little Help from another person to put on and taking off regular upper body clothing?: A Little Help from another person to put on and taking off regular lower body clothing?: A Little 6 Click Score: 19    End of Session Equipment Utilized During Treatment: Rolling walker (2 wheels)  OT Visit Diagnosis: Unsteadiness on feet (R26.81);Muscle weakness (generalized) (M62.81);History of falling (Z91.81);Other symptoms and signs involving cognitive function   Activity Tolerance Patient tolerated treatment well   Patient Left in chair   Nurse Communication          Time: 1859-0931 OT Time Calculation (min): 15 min  Charges: OT General Charges $OT Visit: 1 Visit OT Treatments $Self Care/Home Management : 8-22 mins  Harrel Carina, MS, OTR/L   Harrel Carina 01/12/2022, 5:49 PM

## 2022-01-12 NOTE — ED Notes (Signed)
Fsbs 174  [place] therapist working with pt.  Pt alert, calm and cooperative    pt in recliner

## 2022-01-12 NOTE — ED Notes (Signed)
Pt sitting in recliner no acute distress.  Pt alert.

## 2022-01-12 NOTE — Progress Notes (Signed)
OT Cancellation Note  Patient Details Name: ARRIONA PREST MRN: [id number] DOB: 1941/07/23   Cancelled Treatment:    Reason Eval/Treat Not Completed: Patient at procedure or test/ unavailable (Pt. out of room with nurse in pt care. Will continue to montior, and intervene at a later time, or date.)  Harrel Carina, MS, OTR/L 01/12/2022, 9:52 AM

## 2022-01-12 NOTE — ED Notes (Signed)
Fsbs 129

## 2022-01-12 NOTE — ED Notes (Signed)
Meds given  pt up to bathroom with walker.

## 2022-01-12 NOTE — ED Notes (Signed)
Pt sleeping in recliner. 

## 2022-01-12 NOTE — TOC Progression Note (Signed)
Transition of Care [facility]) - Progression Note    Patient Details  Name: Tara Sandoval MRN: [id number] Date of Birth: [birth year]  [facility]) CM/SW Contact  Shelbie Hutching, RN Phone Number: 01/12/2022, 2:27 PM  Clinical Narrative:    RNCM followed up with patient's daughter via phone.  Patient seems to be doing really well and walking with a walker, minimal assistance to get up out of a chair.  Patient reports that she wants to go home.  Daughter, August reports that she is having to deal with a family situation but that she will return my call.  She has been concerned about taking patient home with her testing positive for COVID.     Expected Discharge Plan: [place] Barriers to Discharge: No Barriers Identified  Expected Discharge Plan and Services Expected Discharge Plan: [place] arrangements for the past 2 months: Single Family Home                                       Social Determinants of Health (SDOH) Interventions    Readmission Risk Interventions No flowsheet data found.

## 2022-01-12 NOTE — ED Notes (Signed)
Pt sitting in recliner.  Pt alert

## 2022-01-13 DIAGNOSIS — I82492 Acute embolism and thrombosis of other specified deep vein of left lower extremity: Secondary | ICD-10-CM | POA: Diagnosis not present

## 2022-01-13 LAB — CBG MONITORING, ED
Glucose-Capillary: 72 mg/dL (ref 70–99)
Glucose-Capillary: 181 mg/dL — ABNORMAL HIGH (ref 70–99)
Glucose-Capillary: 91 mg/dL (ref 70–99)
Glucose-Capillary: 138 mg/dL — ABNORMAL HIGH (ref 70–99)

## 2022-01-13 MED ORDER — AMLODIPINE BESYLATE 10 MG PO TABS: 10.0000 mg | ORAL_TABLET | Freq: Every day | ORAL | 1 refills | Status: AC

## 2022-01-13 NOTE — Progress Notes (Signed)
Physical Therapy Treatment Patient Details Name: Tara Sandoval MRN: [id number] DOB: 1941/01/09 Today's Date: 01/13/2022   History of Present Illness Pt is an 81 yo female who presented to the emergency department for evaluation of bilateral lower extremity pain. Pt diagnosed with nonocclusive DVT in the proximal left popliteal vein. PMH includes DM, CVA, breast CA, htn, asthma, SSS s/p pacemaker, and CHF.    PT Comments    Patient received in recliner. She is perseverating on that someone stole her poncho. With encouragement, patient agrees to ambulation. She performed sit to stand with supervision.  Ambulated 200 feet with min guard/supervision and RW. Patient will continue to benefit from skilled PT while here to improve strength and endurance for safe return home with family.       Recommendations for follow up therapy are one component of a multi-disciplinary discharge planning process, led by the attending physician.  Recommendations may be updated based on patient status, additional functional criteria and insurance authorization.  Follow Up Recommendations  Home health PT     Assistance Recommended at Discharge Intermittent Supervision/Assistance  Patient can return home with the following A little help with walking and/or transfers;A little help with bathing/dressing/bathroom;Help with stairs or ramp for entrance;Direct supervision/assist for medications management;Assistance with cooking/housework   Equipment Recommendations  None recommended by PT    Recommendations for Other Services       Precautions / Restrictions Precautions Precautions: Fall Restrictions Weight Bearing Restrictions: No     Mobility  Bed Mobility               General bed mobility comments: Pt. in recliner upon arrival.    Transfers   Equipment used: Rolling walker (2 wheels) Transfers: Sit to/from Stand Sit to Stand: Supervision                Ambulation/Gait Ambulation/Gait  assistance: Supervision Gait Distance (Feet): 200 Feet Assistive device: Rolling walker (2 wheels) Gait Pattern/deviations: Step-through pattern, Decreased step length - right, Decreased step length - left, Decreased stride length, Trunk flexed Gait velocity: decreased     General Gait Details: slow cadence, no difficulties with RW   Stairs             Wheelchair Mobility    Modified Rankin (Stroke Patients Only)       Balance Overall balance assessment: Modified Independent Sitting-balance support: Feet supported Sitting balance-Leahy Scale: Good     Standing balance support: Bilateral upper extremity supported, During functional activity Standing balance-Leahy Scale: Fair Standing balance comment: close supervision, needs AD                            Cognition Arousal/Alertness: Awake/alert Behavior During Therapy: WFL for tasks assessed/performed Overall Cognitive Status: Within Functional Limits for tasks assessed                                 General Comments: Pt is pleasant and cooperative. talkative        Exercises      General Comments        Pertinent Vitals/Pain Pain Assessment Pain Assessment: No/denies pain    Home Living                          Prior Function            PT Goals (current  goals can now be found in the care plan section) Acute Rehab PT Goals PT Goal Formulation: With patient Time For Goal Achievement: 01/20/22 Potential to Achieve Goals: Good Progress towards PT goals: Progressing toward goals    Frequency    Min 2X/week      PT Plan Discharge plan needs to be updated    Co-evaluation              AM-PAC PT "6 Clicks" Mobility   Outcome Measure  Help needed turning from your back to your side while in a flat bed without using bedrails?: A Little Help needed moving from lying on your back to sitting on the side of a flat bed without using bedrails?: A  Little Help needed moving to and from a bed to a chair (including a wheelchair)?: A Little Help needed standing up from a chair using your arms (e.g., wheelchair or bedside chair)?: A Little Help needed to walk in hospital room?: A Little Help needed climbing 3-5 steps with a railing? : A Little 6 Click Score: 18    End of Session Equipment Utilized During Treatment: Gait belt Activity Tolerance: Patient tolerated treatment well Patient left: in chair;with call bell/phone within reach Nurse Communication: Mobility status PT Visit Diagnosis: Muscle weakness (generalized) (M62.81)     Time: 4097-3532 PT Time Calculation (min) (ACUTE ONLY): 23 min  Charges:  $Gait Training: 23-37 mins                     [employer], PT, GCS 01/13/22,12:36 PM

## 2022-01-13 NOTE — ED Notes (Signed)
Pt resting with eyes closed in recliner. Respirations equal and non labored. Call light within reach. Will continue to monitor for changes.

## 2022-01-13 NOTE — ED Notes (Signed)
Pt is eating her lunch at this time. Call light with in reach. Warm blankets were given to pt. NAD. Will continue to monitor for changes.

## 2022-01-13 NOTE — ED Notes (Signed)
Social Worker and PT here to evaluate pt at this time

## 2022-01-13 NOTE — ED Notes (Signed)
Pt sitting in recliner. Finishing her breakfast. Will continue to monitor for changes

## 2022-01-13 NOTE — ED Notes (Signed)
Checked pts blood sugar and VS. Pt given dinner tray. No other needs or concerns voiced. Will continue to monitor for changes.

## 2022-01-13 NOTE — TOC Transition Note (Signed)
Transition of Care [facility]) - CM/SW Discharge Note   Patient Details  Name: Tara Sandoval MRN: [id number] Date of Birth: 19-Apr-1941  Transition of Care [facility]) CM/SW Contact:  Shelbie Hutching, RN Phone Number: 01/13/2022, 11:50 AM   Clinical Narrative:    Patient has been doing well over the past few days, she is having no COVID symptoms.  PT and OT are now recommending home health.  Daughter August is concerned about the COVID but agrees to pick the patient up this afternoon.  She asks that we send a prescription for [facility] [place]. MD has prescribed the Amlodipine, script sent to pharmacy.  Home Health ordered for RN, PT, OT and SW.  [name] with [facility] has accepted referral for home health.  Patient has needed DME at home.   August reports that she can come get her this afternoon around 3:30 pm.    Final next level of care: [place] Barriers to Discharge: Barriers Resolved   Patient Goals and CMS Choice Patient states their goals for this hospitalization and ongoing recovery are:: Daughter will pick patient up and take her home CMS Medicare.gov Compare Post Acute Care list provided to:: Patient Represenative (must comment) Choice offered to / list presented to : Adult Children  Discharge Placement                       Discharge Plan and Services                DME Arranged: N/A DME Agency: NA       HH Arranged: RN, PT, OT, Nurse's Aide, Social Work [employer] Agency: [place] ([place]) Date [place]: 01/13/22 Time [place]: 1150 Representative spoke with at [place]: [name] ([place]) Interventions     Readmission Risk Interventions No flowsheet data found.

## 2022-01-13 NOTE — Progress Notes (Signed)
Inpatient Diabetes Program Recommendations  AACE/ADA: New Consensus Statement on Inpatient Glycemic Control   Target Ranges:  Prepandial:   less than 140 mg/dL      Peak postprandial:   less than 180 mg/dL (1-2 hours)      Critically ill patients:  140 - 180 mg/dL    Latest Reference Range & Units 01/12/22 08:55 01/12/22 11:24 01/12/22 16:51 01/12/22 21:49 01/13/22 07:17  Glucose-Capillary 70 - 99 mg/dL 117 (H) 155 (H) 174 (H) 129 (H) 72   Review of Glycemic Control  Current orders for Inpatient glycemic control: Semglee 28 units BID, Novolog 0-15 units TID with meals, Glipizide 2.5 mg daily, Metformin XR 500 mg daily  Inpatient Diabetes Program Recommendations:    Insulin: Fasting 72 mg/dl today. Please consider decrease Semglee 26 units BID.  Thanks, Barnie Alderman, RN, MSN, CDE Diabetes Coordinator Inpatient Diabetes Program [phone] (Team Pager from 8am to 5pm)

## 2022-01-13 NOTE — ED Notes (Signed)
Pt is sleeping in recliner in room, awoke easily with verbal stimuli. Checked her Blood sugar. Denies any needs or concerns at this time. Will continue to monitor.

## 2022-01-13 NOTE — ED Notes (Signed)
Pt ambulated to bathroom and back to recliner at this time, with stand by assist. NAD. Call light within reach. Will continue to monitor.

## 2022-01-13 NOTE — ED Notes (Signed)
Pt provided a coloring book, crayons, and a water.

## 2022-01-13 NOTE — ED Notes (Signed)
Helped pt to the bathroom and back to recliner, she was able to ambulate with walker with stand by assist. Call light within reach. Will continue to monitor for changes.

## 2022-01-14 DIAGNOSIS — I82492 Acute embolism and thrombosis of other specified deep vein of left lower extremity: Secondary | ICD-10-CM | POA: Diagnosis not present

## 2022-01-14 LAB — CBG MONITORING, ED
Glucose-Capillary: 69 mg/dL — ABNORMAL LOW (ref 70–99)
Glucose-Capillary: 162 mg/dL — ABNORMAL HIGH (ref 70–99)
Glucose-Capillary: 166 mg/dL — ABNORMAL HIGH (ref 70–99)
Glucose-Capillary: 74 mg/dL (ref 70–99)

## 2022-01-14 MED ORDER — INSULIN GLARGINE-YFGN 100 UNIT/ML ~~LOC~~ SOLN
25.0000 [IU] | Freq: Two times a day (BID) | SUBCUTANEOUS | Status: DC
  Filled 2022-01-14 (×2): qty 0.25

## 2022-01-14 NOTE — ED Notes (Signed)
Daughter picked patient up at this time

## 2022-01-14 NOTE — Progress Notes (Signed)
Inpatient Diabetes Program Recommendations  AACE/ADA: New Consensus Statement on Inpatient Glycemic Control (2015)  Target Ranges:  Prepandial:   less than 140 mg/dL      Peak postprandial:   less than 180 mg/dL (1-2 hours)      Critically ill patients:  140 - 180 mg/dL    Latest Reference Range & Units 01/13/22 07:17 01/13/22 11:31 01/13/22 16:34 01/13/22 22:06  Glucose-Capillary 70 - 99 mg/dL 72 181 (H) 91 138 (H)    Latest Reference Range & Units 01/14/22 07:50  Glucose-Capillary 70 - 99 mg/dL 69 (L)    Current Orders: Semglee 28 units BID   Novolog 0-15 units TID   Glipizide 2.5 mg daily    Metformin XR 500 mg daily     AM CBG 72 yesterday AM AM CBG today 69 this morning   MD- Please consider reducing the Semglee to 25 units BID    --Will follow patient during hospitalization--  Wyn Quaker RN, MSN, CDE Diabetes Coordinator Inpatient Glycemic Control Team Team Pager: [phone] (8a-5p)

## 2022-01-14 NOTE — ED Provider Notes (Signed)
Emergency Medicine Observation Re-evaluation Note  Tara Sandoval is a 81 y.o. female, seen on rounds today.  Pt initially presented to the ED for complaints of Leg Pain  Currently, the patient is resting  Physical Exam  Blood pressure (!) 145/69, pulse 69, temperature 98 F (36.7 C), resp. rate 16, height 5\' 4"  (1.626 m), SpO2 98 %.  Physical Exam General: No apparent distress Neuro: resting     ED Course / MDM   Clinical Course as of 01/14/22 0730  Thu Jan 06, 2022  2034 CBC with Differential(!) [AE]    Clinical Course User Index [AE] Prince [place], [place]    I have reviewed the labs performed to date as well as medications administered while in observation.  Recent changes in the last 24 hours include  none   Plan   Current plan is to continue to wait for SW Patient is not under full IVC at this time.   Vanessa [place], MD 01/14/22 0730

## 2022-01-14 NOTE — ED Notes (Signed)
Patient in recliner at this time. Call bell within reach

## 2022-01-14 NOTE — TOC Progression Note (Signed)
Transition of Care [facility]) - Progression Note    Patient Details  Name: Tara Sandoval MRN: [id number] Date of Birth: [birth year]  Transition of Care [facility]) CM/SW Contact  Shelbie Hutching, RN Phone Number: 01/14/2022, 11:20 AM  Clinical Narrative:    RNCM left a message with daughter for return call before 9 am this morning and then just called her again.  August reports that something came up yesterday and she could not get over to pick up her mother.  She says that she will be here today to get her mother before 5 pm.  She is meeting someone at 12 noon but after that can come to the hospital.  She reports that when she arrives she will just call to the ER if staff can take the patient out to her car.  Home health has been arranged with [facility].  [name] with [facility] notified of discharge set for today.    Expected Discharge Plan: Skilled Nursing Facility Barriers to Discharge: Barriers Resolved  Expected Discharge Plan and Services Expected Discharge Plan: [place] arrangements for the past 2 months: Single Family Home                 DME Arranged: N/A DME Agency: NA       HH Arranged: RN, PT, OT, Nurse's Aide, Social Work [employer] Agency: [place] ([place]) Date [place]: 01/13/22 Time [place]: 1150 Representative spoke with at [place]: [place] ([place]) Interventions    Readmission Risk Interventions No flowsheet data found.

## 2022-01-14 NOTE — ED Notes (Signed)
Given breakfast tray. 

## 2022-01-14 NOTE — ED Provider Notes (Signed)
Per suggestion of diabetic team will reduce the long-acting insulin to 25 units twice daily.   Nena Polio, MD 01/14/22 1008

## 2022-01-14 NOTE — ED Notes (Signed)
Spoke to pts daughter at 74 am. Questioned her on her intentions to pick her up for D/C today, since yesterday was missed with notice. The daughter explained that there were home concerns about a new born baby, and this pt coming there with HX of COVID. Pts D/C was explained to daughter, pending her pick up of her mother today. No guess on time frame. Pts Daughter stated she would come this afternoon to pick her up for D/C.

## 2022-04-24 ENCOUNTER — Emergency Department
Admission: EM | Admit: 2022-04-24 | Discharge: 2022-04-24 | Disposition: A | Discharge status: Home / Self Care | Payer: Medicare Other | Attending: Student in an Organized Health Care Education/Training Program | Admitting: Student in an Organized Health Care Education/Training Program

## 2022-04-24 ENCOUNTER — Emergency Department: Payer: Medicare Other

## 2022-04-24 DIAGNOSIS — R4182 Altered mental status, unspecified: Secondary | ICD-10-CM | POA: Insufficient documentation

## 2022-04-24 DIAGNOSIS — I4891 Unspecified atrial fibrillation: Secondary | ICD-10-CM | POA: Diagnosis not present

## 2022-04-24 DIAGNOSIS — Z794 Long term (current) use of insulin: Secondary | ICD-10-CM | POA: Insufficient documentation

## 2022-04-24 DIAGNOSIS — R410 Disorientation, unspecified: Secondary | ICD-10-CM | POA: Diagnosis not present

## 2022-04-24 DIAGNOSIS — M549 Dorsalgia, unspecified: Secondary | ICD-10-CM | POA: Diagnosis not present

## 2022-04-24 DIAGNOSIS — Z7901 Long term (current) use of anticoagulants: Secondary | ICD-10-CM | POA: Diagnosis not present

## 2022-04-24 LAB — COMPREHENSIVE METABOLIC PANEL
ALT: 8 U/L (ref 0–44)
AST: 13 U/L — ABNORMAL LOW (ref 15–41)
Albumin: 3.5 g/dL (ref 3.5–5.0)
Alkaline Phosphatase: 42 U/L (ref 38–126)
Anion gap: 6 (ref 5–15)
BUN: 17 mg/dL (ref 8–23)
CO2: 30 mmol/L (ref 22–32)
Calcium: 8.6 mg/dL — ABNORMAL LOW (ref 8.9–10.3)
Chloride: 108 mmol/L (ref 98–111)
Creatinine, Ser: 0.93 mg/dL (ref 0.44–1.00)
GFR, Estimated: >60 mL/min (ref >60)
Glucose, Bld: 336 mg/dL — ABNORMAL HIGH (ref 70–99)
Potassium: 3.5 mmol/L (ref 3.5–5.1)
Sodium: 144 mmol/L (ref 135–145)
Total Bilirubin: 0.7 mg/dL (ref 0.3–1.2)
Total Protein: 6.7 g/dL (ref 6.5–8.1)

## 2022-04-24 LAB — CBC
HCT: 42.6 % (ref 36.0–46.0)
Hemoglobin: 14.2 g/dL (ref 12.0–15.0)
MCH: 29.2 pg (ref 26.0–34.0)
MCHC: 33.3 g/dL (ref 30.0–36.0)
MCV: 87.7 fL (ref 80.0–100.0)
Platelets: 235 10*3/uL (ref 150–400)
RBC: 4.86 MIL/uL (ref 3.87–5.11)
RDW: 13.7 % (ref 11.5–15.5)
WBC: 5.8 10*3/uL (ref 4.0–10.5)
nRBC: 0 % (ref 0.0–0.2)

## 2022-04-24 LAB — TROPONIN I (HIGH SENSITIVITY): Troponin I (High Sensitivity): 8 ng/L (ref <18)

## 2022-04-24 LAB — URINALYSIS, COMPLETE (UACMP) WITH MICROSCOPIC
Bilirubin Urine: NEGATIVE
Glucose, UA: >=500 mg/dL — AB
Hgb urine dipstick: NEGATIVE
Ketones, ur: NEGATIVE mg/dL
Leukocytes,Ua: NEGATIVE
Nitrite: NEGATIVE
Protein, ur: 30 mg/dL — AB
Specific Gravity, Urine: 1.021 (ref 1.005–1.030)
pH: 5 (ref 5.0–8.0)

## 2022-04-24 MED ORDER — INSULIN ASPART 100 UNIT/ML IJ SOLN
8.0000 [IU] | Freq: Once | INTRAMUSCULAR | Status: AC
  Administered 2022-04-24: 8 [IU] via SUBCUTANEOUS
  Filled 2022-04-24: qty 1

## 2022-04-24 NOTE — ED Triage Notes (Signed)
Pt states that she has been feeling more confused since she woke up this morning. Able to state name, birthday, location. Thinks it's 2004. Pt states that sometimes she likes to "mess with" people by telling wrong answers. Chronic back pain.  ?

## 2022-04-24 NOTE — ED Notes (Signed)
Daughter called to come pick up her mom  ?

## 2022-04-24 NOTE — ED Triage Notes (Signed)
Pt in via EMS from home with lower abd and lower back pain. EMS reports pt with hx of pacemaker, cva, breast ca. Pt family concerned about confusion this am and not sure if she had a seizure because has hx of the same. EMS reports pt was not post ictal on their arrival  ? ?96% RA, HR 80's ?

## 2022-04-24 NOTE — ED Notes (Signed)
Pt ambulated to toilet down the hall and back.  ?

## 2022-04-24 NOTE — ED Notes (Signed)
RN to bedside to introduce self to pt. Pt CAOx4 and in no acute distress.  ?

## 2022-04-24 NOTE — ED Provider Notes (Signed)
? ?[facility] ?Provider Note ? ? ? Event Date/Time  ? First MD Initiated Contact with Patient 04/24/22 1216   ?  (approximate) ? ? ?History  ? ?Altered Mental Status and Back Pain ? ? ?HPI ? ?Tara Sandoval is a 81 y.o. female [place] A-fib on anticoagulation presents to the ER for evaluation of confusion urine that occurred this morning when she woke up.  Patient having difficulty explaining how she fell.  She currently feels well.  Denies any numbness or tingling no headache.  No chest pain or shortness of breath.  She does describe that she was having trouble finding words and could not communicate she is unsure as to how long this lasted.  She states that she feels well and like to go home to have some ice cream. ?  ? ? ?Physical Exam  ? ?Triage Vital Signs: ?ED Triage Vitals  ?Enc Vitals Group  ?   BP 04/24/22 1113 (!) 162/68  ?   Pulse Rate 04/24/22 1113 61  ?   Resp 04/24/22 1113 18  ?   Temp 04/24/22 1115 97.9 ?F (36.6 ?C)  ?   Temp Source 04/24/22 1115 Oral  ?   SpO2 04/24/22 1113 92 %  ?   Weight --   ?   Height --   ?   Head Circumference --   ?   Peak Flow --   ?   Pain Score 04/24/22 1113 0  ?   Pain Loc --   ?   Pain Edu? --   ?   Excl. in [place]? --   ? ? ?Most recent vital signs: ?Vitals:  ? 04/24/22 1200 04/24/22 1300  ?BP: (!) 200/86 (!) 186/78  ?Pulse: 75 70  ?Resp: 16 16  ?Temp:    ?SpO2: 96% 96%  ? ? ? ?Constitutional: Alert  ?Eyes: Conjunctivae are normal.  ?Head: Atraumatic. ?Nose: No congestion/rhinnorhea. ?Mouth/Throat: Mucous membranes are moist.   ?Neck: Painless ROM.  ?Cardiovascular:   Good peripheral circulation. ?Respiratory: Normal respiratory effort.  No retractions.  ?Gastrointestinal: Soft and nontender.  ?Musculoskeletal:  no deformity ?Neurologic:  CN- intact.  No facial droop, Normal FNF.  Normal heel to shin.  Sensation intact bilaterally. Normal speech and language. No gross focal neurologic deficits are appreciated. No gait instability. ? ?Skin:  Skin is warm,  dry and intact. No rash noted. ?Psychiatric: Mood and affect are normal. Speech and behavior are normal. ? ? ? ?ED Results / Procedures / Treatments  ? ?Labs ?(all labs ordered are listed, but only abnormal results are displayed) ?Labs Reviewed  ?COMPREHENSIVE METABOLIC PANEL - Abnormal; Notable for the following components:  ?    Result Value  ? Glucose, Bld 336 (*)   ? Calcium 8.6 (*)   ? AST 13 (*)   ? All other components within normal limits  ?URINALYSIS, COMPLETE (UACMP) WITH MICROSCOPIC - Abnormal; Notable for the following components:  ? Color, Urine YELLOW (*)   ? APPearance HAZY (*)   ? Glucose, UA >=500 (*)   ? Protein, ur 30 (*)   ? Bacteria, UA RARE (*)   ? All other components within normal limits  ?CBC  ?CBG MONITORING, ED  ?TROPONIN I (HIGH SENSITIVITY)  ? ? ? ?EKG ? ?ED ECG REPORT ?I, Merlyn Lot, the attending physician, personally viewed and interpreted this ECG. ? ? Date: 04/24/2022 ? EKG Time: 11:20 ? Rate: 60 ? Rhythm: a paced ? Axis: normal ? Intervals:  lbbb ? ST&T Change: no stemi, nonspecific st abn ? ? ? ?RADIOLOGY ?Please see ED Course for my review and interpretation. ? ?I personally reviewed all radiographic images ordered to evaluate for the above acute complaints and reviewed radiology reports and findings.  These findings were personally discussed with the patient.  Please see medical record for radiology report. ? ? ? ?PROCEDURES: ? ?Critical Care performed:  ? ?Procedures ? ? ?MEDICATIONS ORDERED IN ED: ?Medications  ?insulin aspart (novoLOG) injection 8 Units (8 Units Subcutaneous Given 04/24/22 1253)  ? ? ? ?IMPRESSION / MDM / ASSESSMENT AND PLAN / ED COURSE  ?I reviewed the triage vital signs and the nursing notes. ?             ?               ? ?Differential diagnosis includes, but is not limited to, Dehydration, sepsis, pna, uti, hypoglycemia, cva, drug effect, withdrawal, encephalitis ? ? ?Patient presented to the ER for symptoms as described above.  She is currently  asymptomatic is a bit unclear as to what occurred earlier today but her exam is reassuring.  Blood work as well as imaging ordered for the above differential.  The patient will be placed on continuous pulse oximetry and telemetry for monitoring.  Laboratory evaluation will be sent to evaluate for the above complaints.   ? ? ?Clinical Course as of 04/24/22 1510  ?Sun Apr 24, 2022  ?1440 Work-up thus far is reassuring.  She has no objective deficits.  Seems less consistent with TIA given lack of focal deficit.  No sign of UTI.  A lower suspicion for seizure.  Chest x-ray unremarkable.  CT imaging without acute abnormality.  Still awaiting troponin but patient denies any chest pain or pressure.  She is requesting discharge home.  At this point of assuming the troponin is negative given her otherwise reassuring work-up with no objective findings on exam think that close outpatient follow-up is reasonable.  We discussed strict return precautions. [PR]  ?  ?Clinical Course User Index ?[PR] Merlyn Lot, MD  ? ? ? ?FINAL CLINICAL IMPRESSION(S) / ED DIAGNOSES  ? ?Final diagnoses:  ?Confusion  ? ? ? ?Rx / DC Orders  ? ?ED Discharge Orders   ? ? None  ? ?  ? ? ? ?Note:  This document was prepared using Dragon voice recognition software and may include unintentional dictation errors. ? ?  ?Merlyn Lot, MD ?04/24/22 1510 ? ?

## 2022-06-30 ENCOUNTER — Emergency Department
Admission: EM | Admit: 2022-06-30 | Discharge: 2022-07-01 | Disposition: A | Discharge status: Home / Self Care | Payer: Medicare Other | Attending: Emergency Medicine | Admitting: Emergency Medicine

## 2022-06-30 ENCOUNTER — Emergency Department: Payer: Medicare Other

## 2022-06-30 DIAGNOSIS — I4891 Unspecified atrial fibrillation: Secondary | ICD-10-CM | POA: Insufficient documentation

## 2022-06-30 DIAGNOSIS — E1165 Type 2 diabetes mellitus with hyperglycemia: Secondary | ICD-10-CM | POA: Insufficient documentation

## 2022-06-30 DIAGNOSIS — I110 Hypertensive heart disease with heart failure: Secondary | ICD-10-CM | POA: Insufficient documentation

## 2022-06-30 DIAGNOSIS — F0390 Unspecified dementia without behavioral disturbance: Secondary | ICD-10-CM | POA: Insufficient documentation

## 2022-06-30 DIAGNOSIS — Z79899 Other long term (current) drug therapy: Secondary | ICD-10-CM | POA: Diagnosis not present

## 2022-06-30 DIAGNOSIS — E876 Hypokalemia: Secondary | ICD-10-CM | POA: Insufficient documentation

## 2022-06-30 DIAGNOSIS — I509 Heart failure, unspecified: Secondary | ICD-10-CM | POA: Insufficient documentation

## 2022-06-30 DIAGNOSIS — R531 Weakness: Secondary | ICD-10-CM

## 2022-06-30 DIAGNOSIS — Z7901 Long term (current) use of anticoagulants: Secondary | ICD-10-CM | POA: Diagnosis not present

## 2022-06-30 DIAGNOSIS — I82502 Chronic embolism and thrombosis of unspecified deep veins of left lower extremity: Secondary | ICD-10-CM | POA: Insufficient documentation

## 2022-06-30 DIAGNOSIS — I2510 Atherosclerotic heart disease of native coronary artery without angina pectoris: Secondary | ICD-10-CM | POA: Insufficient documentation

## 2022-06-30 DIAGNOSIS — I825Y2 Chronic embolism and thrombosis of unspecified deep veins of left proximal lower extremity: Secondary | ICD-10-CM

## 2022-06-30 DIAGNOSIS — R4182 Altered mental status, unspecified: Secondary | ICD-10-CM | POA: Diagnosis present

## 2022-06-30 LAB — CBC
HCT: 40.1 % (ref 36.0–46.0)
Hemoglobin: 13.2 g/dL (ref 12.0–15.0)
MCH: 28.7 pg (ref 26.0–34.0)
MCHC: 32.9 g/dL (ref 30.0–36.0)
MCV: 87.2 fL (ref 80.0–100.0)
Platelets: 272 10*3/uL (ref 150–400)
RBC: 4.6 MIL/uL (ref 3.87–5.11)
RDW: 13.7 % (ref 11.5–15.5)
WBC: 5.4 10*3/uL (ref 4.0–10.5)
nRBC: 0 % (ref 0.0–0.2)

## 2022-06-30 LAB — COMPREHENSIVE METABOLIC PANEL
ALT: 9 U/L (ref 0–44)
AST: 14 U/L — ABNORMAL LOW (ref 15–41)
Albumin: 3.4 g/dL — ABNORMAL LOW (ref 3.5–5.0)
Alkaline Phosphatase: 42 U/L (ref 38–126)
Anion gap: 7 (ref 5–15)
BUN: 14 mg/dL (ref 8–23)
CO2: 28 mmol/L (ref 22–32)
Calcium: 8.5 mg/dL — ABNORMAL LOW (ref 8.9–10.3)
Chloride: 103 mmol/L (ref 98–111)
Creatinine, Ser: 0.82 mg/dL (ref 0.44–1.00)
GFR, Estimated: >60 mL/min (ref >60)
Glucose, Bld: 263 mg/dL — ABNORMAL HIGH (ref 70–99)
Potassium: 3 mmol/L — ABNORMAL LOW (ref 3.5–5.1)
Sodium: 138 mmol/L (ref 135–145)
Total Bilirubin: 0.5 mg/dL (ref 0.3–1.2)
Total Protein: 6.9 g/dL (ref 6.5–8.1)

## 2022-06-30 LAB — TROPONIN I (HIGH SENSITIVITY)
Troponin I (High Sensitivity): 7 ng/L (ref <18)
Troponin I (High Sensitivity): 10 ng/L (ref <18)

## 2022-06-30 LAB — CBG MONITORING, ED: Glucose-Capillary: 269 mg/dL — ABNORMAL HIGH (ref 70–99)

## 2022-06-30 MED ORDER — LABETALOL HCL 5 MG/ML IV SOLN
10.0000 mg | Freq: Once | INTRAVENOUS | Status: AC
  Administered 2022-06-30: 10 mg via INTRAVENOUS
  Filled 2022-06-30: qty 4

## 2022-06-30 MED ORDER — APIXABAN (ELIQUIS) VTE STARTER PACK (10MG AND 5MG): ORAL_TABLET | ORAL | 0 refills | Status: DC

## 2022-06-30 MED ORDER — POTASSIUM CHLORIDE CRYS ER 20 MEQ PO TBCR
40.0000 meq | EXTENDED_RELEASE_TABLET | Freq: Once | ORAL | Status: AC
Start: 2022-07-01 — End: 2022-06-30
  Administered 2022-06-30: 40 meq via ORAL
  Filled 2022-06-30: qty 2

## 2022-06-30 NOTE — ED Notes (Signed)
Pt's daughter called, message left for the daughter to pick up the pt.

## 2022-06-30 NOTE — ED Provider Notes (Signed)
[facility] Provider Note    Event Date/Time   First MD Initiated Contact with Patient 06/30/22 2005     (approximate)   History   Chief Complaint Altered Mental Status   HPI  Tara Sandoval is a 81 y.o. female with past medical history of hypertension, diabetes, CAD, CHF, atrial fibrillation, and dementia who presents to the ED for altered mental status.  History is limited due to patient's baseline dementia and she states she is not sure why she is here.  She does state that she was feeling unsteady on her feet earlier and almost fell, which is why she believes her grandson called EMS.  She states that she is not currently having any dizziness, headache, numbness, or focal weakness.  She does state that she feels slightly weak in general, but denies any fevers, cough, chest pain, shortness of breath, nausea, vomiting, diarrhea, or dysuria.  Family had noted to EMS that they were concerned her blood sugar is high, also noted to be hypertensive by EMS.     Physical Exam   Triage Vital Signs: ED Triage Vitals  Enc Vitals Group     BP 06/30/22 1608 (!) 193/92     Pulse Rate 06/30/22 1608 60     Resp 06/30/22 1608 18     Temp 06/30/22 1612 97.6 F (36.4 C)     Temp Source 06/30/22 1612 Oral     SpO2 06/30/22 1608 96 %     Weight --      Height --      Head Circumference --      Peak Flow --      Pain Score --      Pain Loc --      Pain Edu? --      Excl. in [place]? --     Most recent vital signs: Vitals:   06/30/22 2036 06/30/22 2211  BP: (!) 236/96 (!) 189/72  Pulse: 68 64  Resp: 18 18  Temp:    SpO2: 98% 95%    Constitutional: Alert and oriented to person and place, but not time. Eyes: Conjunctivae are normal. Head: Atraumatic. Nose: No congestion/rhinnorhea. Mouth/Throat: Mucous membranes are moist.  Cardiovascular: Normal rate, regular rhythm. Grossly normal heart sounds.  2+ radial pulses bilaterally. Respiratory: Normal respiratory  effort.  No retractions. Lungs CTAB. Gastrointestinal: Soft and nontender. No distention. Musculoskeletal: No lower extremity tenderness nor edema.  Neurologic:  Normal speech and language. No gross focal neurologic deficits are appreciated.    ED Results / Procedures / Treatments   Labs (all labs ordered are listed, but only abnormal results are displayed) Labs Reviewed  COMPREHENSIVE METABOLIC PANEL - Abnormal; Notable for the following components:      Result Value   Potassium 3.0 (*)    Glucose, Bld 263 (*)    Calcium 8.5 (*)    Albumin 3.4 (*)    AST 14 (*)    All other components within normal limits  CBG MONITORING, ED - Abnormal; Notable for the following components:   Glucose-Capillary 269 (*)    All other components within normal limits  CBC  URINALYSIS, ROUTINE W REFLEX MICROSCOPIC  CBG MONITORING, ED  TROPONIN I (HIGH SENSITIVITY)  TROPONIN I (HIGH SENSITIVITY)     EKG  ED ECG REPORT I, Blake Divine, the attending physician, personally viewed and interpreted this ECG.   Date: 06/30/2022  EKG Time: 16:15  Rate: 61  Rhythm: Ventricular paced rhythm  Axis:  Normal  Intervals:none  ST&T Change: None  RADIOLOGY CT head reviewed and interpreted by me with no hemorrhage or midline shift.  PROCEDURES:  Critical Care performed: No  Procedures   MEDICATIONS ORDERED IN ED: Medications  labetalol (NORMODYNE) injection 10 mg (10 mg Intravenous Given 06/30/22 2108)     IMPRESSION / MDM / ASSESSMENT AND PLAN / ED COURSE  I reviewed the triage vital signs and the nursing notes.                              81 y.o. female with past medical history of hypertension, diabetes, CAD, CHF, atrial fibrillation, and dementia who presents to the ED due to concern for confusion and elevated blood glucose per EMS.  Patient's presentation is most consistent with acute presentation with potential threat to life or bodily function.  Differential diagnosis includes,  but is not limited to, stroke, TIA, other intracranial process, UTI, dehydration, electrolyte abnormality, AKI.  Patient well-appearing and in no acute distress, vital signs remarkable for elevated blood pressure but otherwise reassuring.  Patient answering all questions appropriately at this time but is unable to state correct date, no focal neurologic deficits noted on exam.  This appears similar to her prior baseline noted in PCP notes but due to concern for confusion from family, we will check CT head.  EKG shows no evidence of arrhythmia or ischemia and labs thus far are reassuring.  No significant anemia or leukocytosis noted, troponin within normal limits, she does have mild hypokalemia and hyperglycemia with no AKI.  Plan to check urinalysis but no findings concerning for sepsis at this time.  Given concern for confusion with elevated blood pressure, will treat with IV labetalol.  I was unsuccessful in contacting patient's family to better establish her baseline, will reattempt.  CT head is negative for acute process, daughter is now at bedside and states that patient is at her baseline mental status.  Her primary concern is that patient's left leg seems more swollen than usual and she has been previously diagnosed with a DVT in this leg.  Daughter states that patient is not currently taking any anticoagulation, despite being diagnosed with a DVT in January.  Ultrasound performed and consistent with chronic DVT unchanged from prior imaging.  We will restart patient on Eliquis, no symptoms to suggest PE at this time.  She is appropriate for discharge home with PCP follow-up, patient and daughter agree with plan.      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   Blake Divine, MD 06/30/22 [id number]

## 2022-06-30 NOTE — ED Notes (Signed)
First nurse note- pt brought in via ems from home with weakness, no meds today.  Fsbs 310 per ems.  Iv in place, pt rec 250cc NS bolus per ems.  Pt alert, in wheelchair in lobby.  175/90, oxygen 98% p-82, per ems.

## 2022-06-30 NOTE — ED Triage Notes (Signed)
Pt to ED via [facility] from home. Pt unsure of why she is here and states family called for her to be seen. Chief complaint listed as hyperglycemia.   Pt also reports swelling in her ankles that started last week. Pt with hx HTN, afib, DM. CBG in triage 269.   Pt confused in triage but unsure of baseline.

## 2022-06-30 NOTE — ED Provider Triage Note (Signed)
Emergency Medicine Provider Triage Evaluation Note  Tara Sandoval , a 81 y.o. female  was evaluated in triage.  Pt complains of AMS.  Sent by family via EMS.  States she thinks her blood glucose levels are high.  Also has high blood pressure.  Review of Systems  Positive: AMS Negative:   Physical Exam  BP (!) 193/92   Pulse 60   Temp 97.6 F (36.4 C) (Oral)   Resp 18   SpO2 96%  Gen:   Awake, no distress   Resp:  Normal effort  MSK:   Moves extremities without difficulty  Other:    Medical Decision Making  Medically screening exam initiated at 4:12 PM.  Appropriate orders placed.  Jenetta Downer was informed that the remainder of the evaluation will be completed by another provider, this initial triage assessment does not replace that evaluation, and the importance of remaining in the ED until their evaluation is complete.  Unsure as there is no first note nurse.  Patient does appear to be confused.  Instructed nursing staff to start altered mental status protocols   Versie Starks, PA-C 06/30/22 1613

## 2022-09-05 ENCOUNTER — Emergency Department: Payer: Medicare Other

## 2022-09-05 ENCOUNTER — Emergency Department
Admission: EM | Admit: 2022-09-05 | Discharge: 2022-09-06 | Disposition: A | Discharge status: Home / Self Care | Payer: Medicare Other | Attending: Emergency Medicine | Admitting: Emergency Medicine

## 2022-09-05 DIAGNOSIS — R531 Weakness: Secondary | ICD-10-CM | POA: Diagnosis present

## 2022-09-05 DIAGNOSIS — R42 Dizziness and giddiness: Secondary | ICD-10-CM | POA: Insufficient documentation

## 2022-09-05 DIAGNOSIS — R1084 Generalized abdominal pain: Secondary | ICD-10-CM | POA: Diagnosis not present

## 2022-09-05 DIAGNOSIS — W1839XA Other fall on same level, initial encounter: Secondary | ICD-10-CM | POA: Diagnosis not present

## 2022-09-05 DIAGNOSIS — W19XXXA Unspecified fall, initial encounter: Secondary | ICD-10-CM

## 2022-09-05 DIAGNOSIS — N838 Other noninflammatory disorders of ovary, fallopian tube and broad ligament: Secondary | ICD-10-CM

## 2022-09-05 LAB — TROPONIN I (HIGH SENSITIVITY)
Troponin I (High Sensitivity): 8 ng/L (ref <18)
Troponin I (High Sensitivity): 8 ng/L (ref <18)

## 2022-09-05 LAB — HEPATIC FUNCTION PANEL
ALT: 7 U/L (ref 0–44)
AST: 18 U/L (ref 15–41)
Albumin: 3.6 g/dL (ref 3.5–5.0)
Alkaline Phosphatase: 38 U/L (ref 38–126)
Bilirubin, Direct: 0.2 mg/dL (ref 0.0–0.2)
Indirect Bilirubin: 0.9 mg/dL (ref 0.3–0.9)
Total Bilirubin: 1.1 mg/dL (ref 0.3–1.2)
Total Protein: 6.8 g/dL (ref 6.5–8.1)

## 2022-09-05 LAB — COMPREHENSIVE METABOLIC PANEL
ALT: 8 U/L (ref 0–44)
AST: 18 U/L (ref 15–41)
Albumin: 3.9 g/dL (ref 3.5–5.0)
Alkaline Phosphatase: 40 U/L (ref 38–126)
Anion gap: 10 (ref 5–15)
BUN: 11 mg/dL (ref 8–23)
CO2: 31 mmol/L (ref 22–32)
Calcium: 9.1 mg/dL (ref 8.9–10.3)
Chloride: 104 mmol/L (ref 98–111)
Creatinine, Ser: 0.96 mg/dL (ref 0.44–1.00)
GFR, Estimated: 59 mL/min — ABNORMAL LOW (ref >60)
Glucose, Bld: 173 mg/dL — ABNORMAL HIGH (ref 70–99)
Potassium: 3.5 mmol/L (ref 3.5–5.1)
Sodium: 145 mmol/L (ref 135–145)
Total Bilirubin: 0.8 mg/dL (ref 0.3–1.2)
Total Protein: 7.7 g/dL (ref 6.5–8.1)

## 2022-09-05 LAB — PROTIME-INR
INR: 1.2 (ref 0.8–1.2)
Prothrombin Time: 15 seconds (ref 11.4–15.2)

## 2022-09-05 LAB — CBC
HCT: 46.2 % — ABNORMAL HIGH (ref 36.0–46.0)
Hemoglobin: 15 g/dL (ref 12.0–15.0)
MCH: 28.4 pg (ref 26.0–34.0)
MCHC: 32.5 g/dL (ref 30.0–36.0)
MCV: 87.5 fL (ref 80.0–100.0)
Platelets: 305 10*3/uL (ref 150–400)
RBC: 5.28 MIL/uL — ABNORMAL HIGH (ref 3.87–5.11)
RDW: 14.6 % (ref 11.5–15.5)
WBC: 4.2 10*3/uL (ref 4.0–10.5)
nRBC: 0 % (ref 0.0–0.2)

## 2022-09-05 LAB — LIPASE, BLOOD: Lipase: 26 U/L (ref 11–51)

## 2022-09-05 LAB — BASIC METABOLIC PANEL
Anion gap: 11 (ref 5–15)
BUN: 9 mg/dL (ref 8–23)
CO2: 30 mmol/L (ref 22–32)
Calcium: 8.8 mg/dL — ABNORMAL LOW (ref 8.9–10.3)
Chloride: 103 mmol/L (ref 98–111)
Creatinine, Ser: 0.95 mg/dL (ref 0.44–1.00)
GFR, Estimated: 60 mL/min — ABNORMAL LOW (ref >60)
Glucose, Bld: 182 mg/dL — ABNORMAL HIGH (ref 70–99)
Potassium: 3.4 mmol/L — ABNORMAL LOW (ref 3.5–5.1)
Sodium: 144 mmol/L (ref 135–145)

## 2022-09-05 LAB — CBC WITH DIFFERENTIAL/PLATELET
Abs Immature Granulocytes: 0.03 10*3/uL (ref 0.00–0.07)
Basophils Absolute: 0 10*3/uL (ref 0.0–0.1)
Basophils Relative: 1 %
Eosinophils Absolute: 0.1 10*3/uL (ref 0.0–0.5)
Eosinophils Relative: 2 %
HCT: 46.4 % — ABNORMAL HIGH (ref 36.0–46.0)
Hemoglobin: 15.1 g/dL — ABNORMAL HIGH (ref 12.0–15.0)
Immature Granulocytes: 0 %
Lymphocytes Relative: 18 %
Lymphs Abs: 1.3 10*3/uL (ref 0.7–4.0)
MCH: 28.5 pg (ref 26.0–34.0)
MCHC: 32.5 g/dL (ref 30.0–36.0)
MCV: 87.5 fL (ref 80.0–100.0)
Monocytes Absolute: 0.5 10*3/uL (ref 0.1–1.0)
Monocytes Relative: 7 %
Neutro Abs: 5.1 10*3/uL (ref 1.7–7.7)
Neutrophils Relative %: 72 %
Platelets: 295 10*3/uL (ref 150–400)
RBC: 5.3 MIL/uL — ABNORMAL HIGH (ref 3.87–5.11)
RDW: 14.6 % (ref 11.5–15.5)
WBC: 7.1 10*3/uL (ref 4.0–10.5)
nRBC: 0 % (ref 0.0–0.2)

## 2022-09-05 LAB — LACTIC ACID, PLASMA: Lactic Acid, Venous: 1.7 mmol/L (ref 0.5–1.9)

## 2022-09-05 MED ORDER — IOHEXOL 300 MG/ML  SOLN
100.0000 mL | Freq: Once | INTRAMUSCULAR | Status: AC | PRN
  Administered 2022-09-05: 100 mL via INTRAVENOUS

## 2022-09-05 NOTE — ED Triage Notes (Signed)
Pt fell, no co of pain

## 2022-09-05 NOTE — ED Provider Notes (Signed)
11:30 PM  Assumed care at shift change.  Patient here after fall.  Complaining of abdominal pain.  History of dementia.  Trauma CT scans ordered and pending at time of signout.  Otherwise lab work unremarkable.   12:50 AM  I was contacted by radiology as her CT abdomen pelvis was abnormal.  Imaging reviewed and interpreted by myself and the radiologist.  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  Otherwise CT imaging shows no acute abnormality.  Repeat troponin negative.  Urine shows no sign of infection.   Reviewed imaging from [facility] in July 2021 -   "OVARIES: Only transabdominal imaging was performed. The ovaries were not identified transabdominally. 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 Arterial inflow and venous outflow was identified in this lesion on color and spectral Doppler."   Spoke with her daughter August Graham by phone.  She states that she just noticed the abdominal distention today.  We did discuss that this is likely been ongoing for some time and it is not an acute issue.  She has been told since 7 to 8 years ago per the daughter that she has an ovarian mass that was being watched but has not had any imaging since 2021.  Daughter states that given her age, dementia and comorbidities that she would not want to put her through surgery, biopsy, chemotherapy, radiation so at this time we will hold off on OB/GYN consultation given this does not seem emergent.  Patient's pain appears well controlled here.  They do have a PCP for close follow-up.  I did give the patient's daughter follow-up information for gynecology oncology if she changes her mind at any point.  Will discharge with prescription of pain medication.  Daughter is comfortable with plan for her to go home.  She has no sign of traumatic injury on any of her imaging today and her lab work and urine  is reassuring.   Naimah Yingst, Delice Bison, DO 09/06/22 0211

## 2022-09-05 NOTE — ED Triage Notes (Signed)
Pt unsure on thinners, pt unsure if she had locm pt unsure why she fell, pt possibly confused

## 2022-09-05 NOTE — ED Triage Notes (Signed)
First Nurse Note;  Pt via EMS from home. Pt had an unwitnessed fall, states she may have had some LOC for 1-2 mins. Pt c/o abd pain, pt's abd is distended and tender on palpations. Pt has a hx of dementia. Denies blood thinners. Per is alert and at her baseline.   155 CBG 140/119  98% on RA 95 HR

## 2022-09-05 NOTE — ED Provider Notes (Signed)
[facility] Provider Note    Event Date/Time   First MD Initiated Contact with Patient 09/05/22 2115     (approximate)   History   Fall (Ems brought in , fall , pt lives in her house , no complaints of pain )   HPI  Tara Sandoval is a 81 y.o. female who reports she began feeling poorly today.  She developed diffuse abdominal pain which is fairly bad and got woozy and lightheaded seems like she did fall down and might of passed out for day or 2.      Physical Exam   Triage Vital Signs: ED Triage Vitals  Enc Vitals Group     BP 09/05/22 1806 (!) 155/101     Pulse Rate 09/05/22 1806 85     Resp 09/05/22 1832 20     Temp 09/05/22 1806 97.8 F (36.6 C)     Temp Source 09/05/22 1806 Oral     SpO2 09/05/22 1806 99 %     Weight 09/05/22 1807 165 lb 5.5 oz (75 kg)     Height 09/05/22 1807 '5\' 4"'$  (1.626 m)     Head Circumference --      Peak Flow --      Pain Score --      Pain Loc --      Pain Edu? --      Excl. in [place]? --     Most recent vital signs: Vitals:   09/05/22 2120 09/05/22 2300  BP: (!) 140/89 (!) 196/99  Pulse: 66 63  Resp: 15 18  Temp:  97.7 F (36.5 C)  SpO2: 100% 96%     General: Awake, alert complaining of not feeling good CV:  Good peripheral perfusion.  Heart regular rate and rhythm no audible murmurs Resp:  Normal effort.  Lungs occasional scattered crackles to the bases but very few mostly clear Abd:  No distention.  Diffusely tender to palpation    ED Results / Procedures / Treatments   Labs (all labs ordered are listed, but only abnormal results are displayed) Labs Reviewed  BASIC METABOLIC PANEL - Abnormal; Notable for the following components:      Result Value   Potassium 3.4 (*)    Glucose, Bld 182 (*)    Calcium 8.8 (*)    GFR, Estimated 60 (*)    All other components within normal limits  CBC - Abnormal; Notable for the following components:   RBC 5.28 (*)    HCT 46.2 (*)    All other components  within normal limits  COMPREHENSIVE METABOLIC PANEL - Abnormal; Notable for the following components:   Glucose, Bld 173 (*)    GFR, Estimated 59 (*)    All other components within normal limits  CBC WITH DIFFERENTIAL/PLATELET - Abnormal; Notable for the following components:   RBC 5.30 (*)    Hemoglobin 15.1 (*)    HCT 46.4 (*)    All other components within normal limits  URINE CULTURE  PROTIME-INR  HEPATIC FUNCTION PANEL  LIPASE, BLOOD  LACTIC ACID, PLASMA  URINALYSIS, ROUTINE W REFLEX MICROSCOPIC  LACTIC ACID, PLASMA  TROPONIN I (HIGH SENSITIVITY)  TROPONIN I (HIGH SENSITIVITY)     EKG  EKG read and interpreted by me shows normal sinus rhythm at rate of 79 left bundle branch block old ST segment depression laterally in V5 V6 today there is ST segment depression T wave inversion in 1 and L previously it was inferiorly.  Not sure if this is because of lead placement or exactly what.  Patient does not have any chest pain or tightness or shortness of breath Repeat EKG later shows normal sinus rhythm at 60 normal axis there is some ST segment downsloping and T wave inversion inferiorly and in the lateral chest leads more similar to previous EKGs. RADIOLOGY    PROCEDURES:  Critical Care performed:   Procedures   MEDICATIONS ORDERED IN ED: Medications  iohexol (OMNIPAQUE) 300 MG/ML solution 100 mL (100 mLs Intravenous Contrast Given 09/05/22 2352)     IMPRESSION / MDM / ASSESSMENT AND PLAN / ED COURSE  I reviewed the triage vital signs and the nursing notes. Patient with weakness apparent episode of falling and abdominal pain. Opponent has been negative white count is not elevated electrolytes are not bad although glucose is slightly elevated 173 we will await imaging.  I am going To sign the patient out to oncoming physician. Differential diagnosis includes, but is not limited to, intra-abdominal process like colitis etc. it is possible this could make the patient weak and  fall.  CT of the head and neck is also still pending although patient does not have any complaints of pain in that area  Patient's presentation is most consistent with acute complicated illness / injury requiring diagnostic workup.  The patient is on the cardiac monitor to evaluate for evidence of arrhythmia and/or significant heart rate changes.  None have been seen so far      FINAL CLINICAL IMPRESSION(S) / ED DIAGNOSES   Final diagnoses:  Fall, initial encounter  Weakness  Generalized abdominal pain     Rx / DC Orders   ED Discharge Orders     None        Note:  This document was prepared using Dragon voice recognition software and may include unintentional dictation errors.   Nena Polio, MD 09/05/22 [phone]

## 2022-09-06 DIAGNOSIS — R531 Weakness: Secondary | ICD-10-CM | POA: Diagnosis not present

## 2022-09-06 LAB — URINALYSIS, ROUTINE W REFLEX MICROSCOPIC
Bacteria, UA: NONE SEEN
Bilirubin Urine: NEGATIVE
Glucose, UA: 50 mg/dL — AB
Hgb urine dipstick: NEGATIVE
Ketones, ur: 5 mg/dL — AB
Leukocytes,Ua: NEGATIVE
Nitrite: NEGATIVE
Protein, ur: 100 mg/dL — AB
Specific Gravity, Urine: >1.046 — ABNORMAL HIGH (ref 1.005–1.030)
pH: 5 (ref 5.0–8.0)

## 2022-09-06 LAB — LACTIC ACID, PLASMA: Lactic Acid, Venous: 1.8 mmol/L (ref 0.5–1.9)

## 2022-09-06 MED ORDER — DOCUSATE SODIUM 100 MG PO CAPS: 100.0000 mg | ORAL_CAPSULE | Freq: Two times a day (BID) | ORAL | 2 refills | Status: DC

## 2022-09-06 MED ORDER — OXYCODONE HCL 5 MG PO TABS: 5.0000 mg | ORAL_TABLET | Freq: Four times a day (QID) | ORAL | 0 refills | Status: DC | PRN

## 2022-09-06 MED ORDER — ONDANSETRON 4 MG PO TBDP: 4.0000 mg | ORAL_TABLET | Freq: Four times a day (QID) | ORAL | 0 refills | Status: DC | PRN

## 2022-09-06 NOTE — ED Notes (Signed)
Provider aware of ongoing HTN. NO new orders placed

## 2022-09-06 NOTE — ED Notes (Signed)
Called EMS for transport back home

## 2022-09-06 NOTE — Discharge Instructions (Addendum)

## 2022-09-07 LAB — URINE CULTURE

## 2022-10-12 ENCOUNTER — Encounter: Payer: Self-pay | Admitting: Obstetrics and Gynecology

## 2022-10-12 ENCOUNTER — Inpatient Hospital Stay: Payer: Medicare Other | Attending: Obstetrics and Gynecology | Admitting: Obstetrics and Gynecology

## 2022-10-12 ENCOUNTER — Ambulatory Visit: Payer: Medicare Other

## 2022-10-12 DIAGNOSIS — I5032 Chronic diastolic (congestive) heart failure: Secondary | ICD-10-CM | POA: Diagnosis not present

## 2022-10-12 DIAGNOSIS — R1900 Intra-abdominal and pelvic swelling, mass and lump, unspecified site: Secondary | ICD-10-CM | POA: Insufficient documentation

## 2022-10-12 DIAGNOSIS — Z7985 Long-term (current) use of injectable non-insulin antidiabetic drugs: Secondary | ICD-10-CM | POA: Insufficient documentation

## 2022-10-12 DIAGNOSIS — E119 Type 2 diabetes mellitus without complications: Secondary | ICD-10-CM | POA: Insufficient documentation

## 2022-10-12 DIAGNOSIS — F0390 Unspecified dementia without behavioral disturbance: Secondary | ICD-10-CM | POA: Diagnosis not present

## 2022-10-12 DIAGNOSIS — Z794 Long term (current) use of insulin: Secondary | ICD-10-CM | POA: Insufficient documentation

## 2022-10-12 DIAGNOSIS — R350 Frequency of micturition: Secondary | ICD-10-CM | POA: Insufficient documentation

## 2022-10-12 DIAGNOSIS — Z79899 Other long term (current) drug therapy: Secondary | ICD-10-CM | POA: Insufficient documentation

## 2022-10-12 DIAGNOSIS — Z7984 Long term (current) use of oral hypoglycemic drugs: Secondary | ICD-10-CM | POA: Insufficient documentation

## 2022-10-12 DIAGNOSIS — I480 Paroxysmal atrial fibrillation: Secondary | ICD-10-CM | POA: Diagnosis not present

## 2022-10-12 DIAGNOSIS — R918 Other nonspecific abnormal finding of lung field: Secondary | ICD-10-CM | POA: Insufficient documentation

## 2022-10-12 DIAGNOSIS — I110 Hypertensive heart disease with heart failure: Secondary | ICD-10-CM | POA: Diagnosis not present

## 2022-10-12 DIAGNOSIS — Z7901 Long term (current) use of anticoagulants: Secondary | ICD-10-CM | POA: Insufficient documentation

## 2022-10-12 DIAGNOSIS — Z7982 Long term (current) use of aspirin: Secondary | ICD-10-CM | POA: Diagnosis not present

## 2022-10-12 NOTE — Progress Notes (Signed)
Gynecologic Oncology Consult Visit   Referring Provider: Dr Edwina Barth  Chief Concern: Large elvic mass  Subjective:  Tara Sandoval is a 81 y.o. female who is seen in consultation from Dr. Edwina Barth for pelvic mass.    09/05/22 seen in [facility] ED visit for abdominal pain and fall.   CT ABDOMEN PELVIS FINDINGS Hepatobiliary: No focal liver abnormality is seen. No gallstones, gallbladder wall thickening, or biliary dilatation.   Pancreas: Unremarkable. No pancreatic ductal dilatation or surrounding inflammatory changes.   Spleen: Normal in size without focal abnormality.   Adrenals/Urinary Tract: Adrenal glands are unremarkable. Low-attenuation lesions in the kidneys are statistically likely to represent cysts. No follow-up is required. Nonobstructing stone in the right kidney. No hydronephrosis. Bladder is nondistended   Stomach/Bowel: Moderate hiatal hernia. Normal caliber large and small bowel. No evidence of bowel wall thickening or inflammatory change.   Vascular/Lymphatic: Aorto bi-iliac atherosclerosis. No definite lymphadenopathy.   Reproductive: Large low-density mass nearly fills the abdominopelvic space. The mass demonstrates some peripheral calcifications and thin walled internal septations. This is favored to be ovarian in origin likely from the right ovary given the partially cystic and solid mass on CT 05/27/2019. The mass measures 21.2 x 15.3 x 27.0 cm and exhibits marked mass effect on the adjacent bowel and vasculature.   Other: Small volume abdominopelvic ascites.  Body wall anasarca.   Musculoskeletal: No acute osseous abnormality. Thoracolumbar spondylosis.   IMPRESSION: Massive cystic lesion filling the abdominopelvic cavity likely originating from the right ovary and measuring up to 27 cm in greatest dimension. This is consistent with ovarian neoplasm. MRI may be helpful for further characterization and gynecologic consultation is recommended.   Small  volume abdominopelvic ascites.   1.5 cm ground-glass nodule in the right upper lobe. Initial follow-up with CT at 6 months is recommended to confirm persistence. If persistent, repeat CT is recommended every 2 years until 5 years of stability has been established. This recommendation follows the consensus statement: Guidelines for Management of Incidental Pulmonary Nodules Detected on CT Images: From the Fleischner Society 2017; Radiology 2017; 284:228-243.    ED notes 12:50 AM  I was contacted by radiology as her CT abdomen pelvis was abnormal.  Imaging reviewed and interpreted by myself and the radiologist.  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  Otherwise CT imaging shows no acute abnormality.  Repeat troponin negative.  Urine shows no sign of infection.   Reviewed imaging from [facility] in July 2021 -    "OVARIES: Only transabdominal imaging was performed. The ovaries were not identified transabdominally. 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 Arterial inflow and venous outflow was identified in this lesion on color and spectral Doppler."   Spoke with her daughter Tara Sandoval by phone.  She states that she just noticed the abdominal distention today.  We did discuss that this is likely been ongoing for some time and it is not an acute issue.  She has been told since 7 to 8 years ago per the daughter that she has an ovarian mass that was being watched but has not had any imaging since 2021.  Daughter states that given her age, dementia and comorbidities that she would not want to put her through surgery, biopsy, chemotherapy, radiation so at this time we will hold off on OB/GYN consultation given this does not seem emergent.  Patient's pain appears well controlled here.  They do have a PCP for close follow-up.  I did give the patient's daughter follow-up  information for gynecology oncology if she changes her mind at any point.  Will discharge with prescription of pain medication.  Daughter is comfortable with plan for her to go home.  She has no sign of traumatic injury on any of her imaging today and her lab work and urine is reassuring.  Patient accompanied by daughters today.  She has been debilitated by this very large mass that causes pressure and urinary frequency, but they are reluctant to have surgery. Patient lives with family and has some dementia.    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 No pertinent family history.  Social History: Social History   Socioeconomic History   Marital status: Widowed    Spouse name: Not on file   Number of children: Not on file   Years of education: Not on file   Highest education level: Not on file  Occupational History   Not on file  Tobacco Use   Smoking status: Never   Smokeless tobacco: Never  Substance and Sexual Activity   Alcohol use: Not Currently   Drug use: Not Currently   Sexual activity: Not Currently  Other  Topics Concern   Not on file  Social History Narrative   Not on file   Social Determinants of Health   Financial Resource Strain: Not on file  Food Insecurity: Not on file  Transportation Needs: Not on file  Physical Activity: Not on file  Stress: Not on file  Social Connections: Not on file  Intimate Partner Violence: Not on file    Allergies: Allergies  Allergen Reactions   Penicillins Hives   Simvastatin Hives and Other (See Comments)   Iron Itching and Other (See Comments)    Pt. States doesn't bother Pt. States doesn't bother     Current Medications: Current Outpatient Medications  Medication Sig Dispense Refill   acetaminophen (TYLENOL) 650 MG CR tablet Take 650 mg by mouth every 8 (eight) hours as needed for pain.     albuterol (VENTOLIN HFA) 108 (90 Base) MCG/ACT inhaler Inhale 2 puffs into the lungs every 6 (six) hours as needed for wheezing or shortness of breath.     amLODipine (NORVASC) 10 MG tablet Take 1 tablet (10 mg total) by mouth daily. 90 tablet 1   amLODipine (NORVASC) 10 MG tablet Take 1 tablet (10 mg total) by mouth daily. 30 tablet 1   aspirin 81 MG chewable tablet Chew 81 mg by mouth daily.  atorvastatin (LIPITOR) 80 MG tablet Take 80 mg by mouth daily.     carvedilol (COREG) 25 MG tablet Take 25 mg by mouth 2 (two) times daily.     docusate sodium (COLACE) 100 MG capsule Take 1 capsule (100 mg total) by mouth 2 (two) times daily. 60 capsule 2   glipiZIDE (GLUCOTROL XL) 2.5 MG 24 hr tablet Take 2.5 mg by mouth daily.     hydrALAZINE (APRESOLINE) 25 MG tablet Take 25 mg by mouth in the morning and at bedtime.      insulin aspart (NOVOLOG) 100 UNIT/ML injection Inject 10-16 Units into the skin 3 (three) times daily with meals.     Insulin Glargine (BASAGLAR KWIKPEN) 100 UNIT/ML Inject 28 Units into the skin 2 (two) times daily.     liraglutide (VICTOZA) 18 MG/3ML SOPN Inject 0.6 mg into the skin as needed. If BS over 600     lisinopril (ZESTRIL) 20  MG tablet Take 20 mg by mouth daily.     metFORMIN (GLUCOPHAGE-XR) 500 MG 24 hr tablet Take 500 mg by mouth daily.     nitroGLYCERIN (NITROSTAT) 0.4 MG SL tablet Place 0.4 mg under the tongue See admin instructions. Place 1 tablet (0.5 mg) under the tongue every five minutes as needed for chest pain. May take up to three doses.     ondansetron (ZOFRAN-ODT) 4 MG disintegrating tablet Take 1 tablet (4 mg total) by mouth every 6 (six) hours as needed for nausea or vomiting. 20 tablet 0   oxyCODONE (ROXICODONE) 5 MG immediate release tablet Take 1 tablet (5 mg total) by mouth every 6 (six) hours as needed. 20 tablet 0   RYBELSUS 7 MG TABS Take 1 tablet by mouth daily.     albuterol (PROVENTIL) (2.5 MG/3ML) 0.083% nebulizer solution Inhale 2.5 mg into the lungs every 4 (four) hours as needed. (Patient not taking: Reported on 11/30/2020)     APIXABAN (ELIQUIS) VTE STARTER PACK ('10MG'$  AND '5MG'$ ) Take as directed on package: start with two-'5mg'$  tablets twice daily for 7 days. On day 8, switch to one-'5mg'$  tablet twice daily. (Patient not taking: Reported on 10/12/2022) 1 each 0   aspirin 81 MG EC tablet Take 81 mg by mouth daily. (Patient not taking: Reported on 11/30/2020)     No current facility-administered medications for this visit.    Review of Systems General: negative for, fevers, chills, fatigue, changes in sleep Skin: negative for changes in color, texture, moles or lesions Eyes: negative for, changes in vision, pain, diplopia HEENT: negative for, change in hearing, pain, discharge, tinnitus, vertigo, voice changes, sore throat, neck masses Pulmonary: negative for, dyspnea, orthopnea, productive cough Cardiac: negative for, palpitations, syncope, pain, discomfort, pressure Gastrointestinal: negative for, dysphagia, nausea, vomiting, jaundice, diarrhea, hematemesis, hematochezia Musculoskeletal: negative for, pain, stiffness, swelling, range of motion limitation Hematology: negative for, easy  bruising, bleeding Neurologic/Psych: negative for, headaches, paralysis, weakness, tremor, change in gait, change in sensation, mood swings, depression, anxiety  Objective:  Physical Examination:  BP (!) 173/99   Pulse 64   Temp 97.8 F (36.6 C)   Resp 19   Wt 129 lb (58.5 kg)   SpO2 99%   BMI 22.14 kg/m    ECOG Performance Status: 2 - Symptomatic, <50% confined to bed  General appearance: cooperative and appears stated age HEENT:PERRLA and thyroid without masses Lymph node survey: non-palpable, axillary, inguinal, supraclavicular Cardiovascular: regular rate and rhythm, no murmurs or gallops Respiratory: normal air entry, lungs clear to auscultation Abdomen: Markedly  distended with soft mass.  Back: inspection of back is normal Extremities: extremities normal, atraumatic, no cyanosis or edema Skin exam - normal coloration and turgor, no rashes, no suspicious skin lesions noted. Neurological exam reveals some decreased mental status  Pelvic: exam chaperoned by nurse, EGBUS within normal limits, normal vagina and vulva;  Vulva: normal appearing vulva with no masses, tenderness, small skin tag on right; Vagina: normal vagina; Adnexa: large cystic mass palpable Rectal: confirms  Lab Review Lab Results  Component Value Date   WBC 7.1 09/05/2022   HGB 15.1 (H) 09/05/2022   HCT 46.4 (H) 09/05/2022   MCV 87.5 09/05/2022   PLT 295 09/05/2022     Chemistry      Component Value Date/Time   NA 145 09/05/2022 2222   K 3.5 09/05/2022 2222   CL 104 09/05/2022 2222   CO2 31 09/05/2022 2222   BUN 11 09/05/2022 2222   CREATININE 0.96 09/05/2022 2222      Component Value Date/Time   CALCIUM 9.1 09/05/2022 2222   ALKPHOS 40 09/05/2022 2222   AST 18 09/05/2022 2222   ALT 8 09/05/2022 2222   BILITOT 0.8 09/05/2022 2222           Assessment:  Tara Sandoval is a 81 y.o. female diagnosed with very large cystic pelvic mass with some fine septations.  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 She is not an ideal surgical candidate in view of medical co-morbidities.  Medical co-morbidities complicating care: CAD, AODM, .  Plan:   Problem List Items Addressed This Visit       Other   Pelvic mass in female   Relevant Orders   CA 125   CEA   CT GUIDED NEEDLE PLACEMENT   We discussed options for management including surgery to remove the mass versus drainage by interventional radiology.  Patient accompanied by daughters today.  She has been debilitated by this very large mass, but they are reluctant to have surgery. Patient lives with family and has some dementia.  We will check CA125 and CEA.    Through shared decision making we made a plan to attempt drainage in radiology and if this is successful it could provide considerable relief.  If this is not successful because it is a mucinous tumor and not drainable or if the mass is drained and then re-accumulates rapidly we can make plans for surgery to remove the mass if medical clearance is obtained.   Will follow up in 3 months if mass drained, or sooner if this proves to not be feasible.   The patient's diagnosis, an outline of the further diagnostic and laboratory studies which will be required, the recommendation, and alternatives were discussed.  All questions were answered to the patient's satisfaction.  A total of 60 minutes were spent with the patient/family today; 50% was spent in education, counseling and coordination of care for large pelvic mass.    Mellody Drown, MD  CC:  Baxter Hire, MD [place],  [place] [address] [phone]

## 2022-10-14 ENCOUNTER — Telehealth: Payer: Self-pay

## 2022-10-14 NOTE — Telephone Encounter (Signed)
I spoke with patient daughter August Graham to update her on recent scheduled  Ultrasound Guided Drain on 10/6 '@10'$ :30 am.

## 2022-10-15 LAB — CEA: CEA: 3.5 ng/mL (ref 0.0–4.7)

## 2022-10-18 NOTE — Progress Notes (Signed)
Patient for Tara Sandoval guided FNA aspiration of RT Cystic Pelvic Mass on Thurs 10/20/22, I called and spoke with the patient's daughter on the phone and gave pre-procedure instructions.  The pt's daughter was made aware to be here at 10:30a. Per Dr Denna Haggard - Local only - Pt's daughter stated understanding. Called 10/18/22

## 2022-10-19 ENCOUNTER — Other Ambulatory Visit (HOSPITAL_COMMUNITY): Payer: Self-pay | Admitting: Physician Assistant

## 2022-10-19 DIAGNOSIS — R1900 Intra-abdominal and pelvic swelling, mass and lump, unspecified site: Secondary | ICD-10-CM

## 2022-10-19 LAB — CA 125: Cancer Antigen (CA) 125: 36.7 U/mL (ref 0.0–38.1)

## 2022-10-20 ENCOUNTER — Ambulatory Visit
Admission: RE | Admit: 2022-10-20 | Discharge: 2022-10-20 | Disposition: A | Discharge status: Home / Self Care | Payer: Medicare Other | Source: Ambulatory Visit | Attending: Nurse Practitioner | Admitting: Nurse Practitioner

## 2022-10-20 VITALS — BP 130/68 | HR 59

## 2022-10-20 DIAGNOSIS — N83202 Unspecified ovarian cyst, left side: Secondary | ICD-10-CM | POA: Diagnosis not present

## 2022-10-20 DIAGNOSIS — N83201 Unspecified ovarian cyst, right side: Secondary | ICD-10-CM | POA: Insufficient documentation

## 2022-10-20 DIAGNOSIS — R1900 Intra-abdominal and pelvic swelling, mass and lump, unspecified site: Secondary | ICD-10-CM

## 2022-10-20 MED ORDER — LIDOCAINE HCL (PF) 1 % IJ SOLN
9.0000 mL | Freq: Once | INTRAMUSCULAR | Status: AC
  Administered 2022-10-20: 9 mL via INTRADERMAL

## 2022-10-20 MED ORDER — LIDOCAINE HCL (PF) 1 % IJ SOLN
5.0000 mL | Freq: Once | INTRAMUSCULAR | Status: AC
  Administered 2022-10-20: 5 mL via INTRADERMAL

## 2022-10-24 LAB — CYTOLOGY - NON PAP

## 2022-10-25 ENCOUNTER — Telehealth: Payer: Self-pay

## 2022-10-25 NOTE — Telephone Encounter (Signed)
Call placed to daughter, August, to see how Tara Sandoval was doing after having ovarian cyst drained. She feels better after having it drained. Reports dementia is slightly worse. She is trying to get her into a nursing facility. Reviewed pathology from fluid with her. If non-malignant Dr. Fransisca Connors wanted to see her back in 3 months. Appointment arranged. Encouraged to call sooner if they feel fluid is reaccumulating.

## 2022-11-16 ENCOUNTER — Emergency Department: Payer: Medicare Other

## 2022-11-16 ENCOUNTER — Emergency Department
Admission: EM | Admit: 2022-11-16 | Discharge: 2022-11-16 | Disposition: A | Discharge status: Home / Self Care | Payer: Medicare Other | Attending: Emergency Medicine | Admitting: Emergency Medicine

## 2022-11-16 DIAGNOSIS — W19XXXA Unspecified fall, initial encounter: Secondary | ICD-10-CM

## 2022-11-16 DIAGNOSIS — W06XXXA Fall from bed, initial encounter: Secondary | ICD-10-CM | POA: Insufficient documentation

## 2022-11-16 DIAGNOSIS — Z20822 Contact with and (suspected) exposure to covid-19: Secondary | ICD-10-CM | POA: Insufficient documentation

## 2022-11-16 DIAGNOSIS — S0003XA Contusion of scalp, initial encounter: Secondary | ICD-10-CM | POA: Diagnosis not present

## 2022-11-16 DIAGNOSIS — E119 Type 2 diabetes mellitus without complications: Secondary | ICD-10-CM | POA: Insufficient documentation

## 2022-11-16 DIAGNOSIS — S0990XA Unspecified injury of head, initial encounter: Secondary | ICD-10-CM | POA: Diagnosis present

## 2022-11-16 DIAGNOSIS — Y92003 Bedroom of unspecified non-institutional (private) residence as the place of occurrence of the external cause: Secondary | ICD-10-CM | POA: Diagnosis not present

## 2022-11-16 DIAGNOSIS — M25512 Pain in left shoulder: Secondary | ICD-10-CM | POA: Diagnosis not present

## 2022-11-16 DIAGNOSIS — I10 Essential (primary) hypertension: Secondary | ICD-10-CM | POA: Diagnosis not present

## 2022-11-16 LAB — URINALYSIS, ROUTINE W REFLEX MICROSCOPIC
Bilirubin Urine: NEGATIVE
Glucose, UA: 50 mg/dL — AB
Hgb urine dipstick: NEGATIVE
Ketones, ur: NEGATIVE mg/dL
Leukocytes,Ua: NEGATIVE
Nitrite: NEGATIVE
Protein, ur: 30 mg/dL — AB
Specific Gravity, Urine: 1.018 (ref 1.005–1.030)
pH: 6 (ref 5.0–8.0)

## 2022-11-16 LAB — COMPREHENSIVE METABOLIC PANEL
ALT: 8 U/L (ref 0–44)
AST: 17 U/L (ref 15–41)
Albumin: 3.5 g/dL (ref 3.5–5.0)
Alkaline Phosphatase: 46 U/L (ref 38–126)
Anion gap: 8 (ref 5–15)
BUN: 14 mg/dL (ref 8–23)
CO2: 30 mmol/L (ref 22–32)
Calcium: 8.6 mg/dL — ABNORMAL LOW (ref 8.9–10.3)
Chloride: 104 mmol/L (ref 98–111)
Creatinine, Ser: 0.88 mg/dL (ref 0.44–1.00)
GFR, Estimated: >60 mL/min (ref >60)
Glucose, Bld: 153 mg/dL — ABNORMAL HIGH (ref 70–99)
Potassium: 3.6 mmol/L (ref 3.5–5.1)
Sodium: 142 mmol/L (ref 135–145)
Total Bilirubin: 0.5 mg/dL (ref 0.3–1.2)
Total Protein: 7.1 g/dL (ref 6.5–8.1)

## 2022-11-16 LAB — CBC WITH DIFFERENTIAL/PLATELET
Abs Immature Granulocytes: 0.02 10*3/uL (ref 0.00–0.07)
Basophils Absolute: 0.1 10*3/uL (ref 0.0–0.1)
Basophils Relative: 1 %
Eosinophils Absolute: 0.3 10*3/uL (ref 0.0–0.5)
Eosinophils Relative: 4 %
HCT: 44.4 % (ref 36.0–46.0)
Hemoglobin: 14.6 g/dL (ref 12.0–15.0)
Immature Granulocytes: 0 %
Lymphocytes Relative: 30 %
Lymphs Abs: 2.2 10*3/uL (ref 0.7–4.0)
MCH: 28.6 pg (ref 26.0–34.0)
MCHC: 32.9 g/dL (ref 30.0–36.0)
MCV: 86.9 fL (ref 80.0–100.0)
Monocytes Absolute: 0.7 10*3/uL (ref 0.1–1.0)
Monocytes Relative: 9 %
Neutro Abs: 4.2 10*3/uL (ref 1.7–7.7)
Neutrophils Relative %: 56 %
Platelets: 300 10*3/uL (ref 150–400)
RBC: 5.11 MIL/uL (ref 3.87–5.11)
RDW: 15.8 % — ABNORMAL HIGH (ref 11.5–15.5)
WBC: 7.4 10*3/uL (ref 4.0–10.5)
nRBC: 0 % (ref 0.0–0.2)

## 2022-11-16 LAB — RESP PANEL BY RT-PCR (FLU A&B, COVID) ARPGX2
Influenza A by PCR: NEGATIVE
Influenza B by PCR: NEGATIVE
SARS Coronavirus 2 by RT PCR: NEGATIVE

## 2022-11-16 MED ORDER — AMLODIPINE BESYLATE 5 MG PO TABS
10.0000 mg | ORAL_TABLET | Freq: Once | ORAL | Status: AC
  Administered 2022-11-16: 10 mg via ORAL
  Filled 2022-11-16: qty 2

## 2022-11-16 MED ORDER — LABETALOL HCL 5 MG/ML IV SOLN
10.0000 mg | Freq: Once | INTRAVENOUS | Status: AC
  Administered 2022-11-16: 10 mg via INTRAVENOUS
  Filled 2022-11-16: qty 4

## 2022-11-16 MED ORDER — CARVEDILOL 25 MG PO TABS
25.0000 mg | ORAL_TABLET | Freq: Once | ORAL | Status: AC
  Administered 2022-11-16: 25 mg via ORAL
  Filled 2022-11-16: qty 1

## 2022-11-16 MED ORDER — SODIUM CHLORIDE 0.9 % IV BOLUS
1000.0000 mL | Freq: Once | INTRAVENOUS | Status: AC
  Administered 2022-11-16: 1000 mL via INTRAVENOUS

## 2022-11-16 NOTE — ED Notes (Signed)
2nd set of blood cultures obtained from IV start on left forearm

## 2022-11-16 NOTE — ED Notes (Signed)
This rn at bedside.  Pt in soiled brief, provided peri care and new brief. Pt placed in clean gown.  Alert and oriented x3, is not oriented to time/date.

## 2022-11-16 NOTE — ED Triage Notes (Signed)
BIBA for mechanical fall at home. Patient attempted to get up to bed to go to bathroom and fell. C/o of posterior head pain, and left shoulder pain. Denies LOC. Strong foul smelling odor of urine.

## 2022-11-16 NOTE — ED Provider Notes (Signed)
[facility] Provider Note   Event Date/Time   First MD Initiated Contact with Patient 11/16/22 [phone]     (approximate) History  Fall and Dysuria  HPI Tara Sandoval is a 81 y.o. female stated past medical history of hypertension, type 2 diabetes, and paroxysmal atrial fibrillation who presents via EMS after mechanical fall from home.  Patient states that she attempted to get up from her bed and fell backwards onto her butt as well as striking her head on the floor.  Patient complains of posterior head pain and left shoulder pain.  Patient is able to range this left shoulder with only minimal pain. ROS: Patient currently denies any vision changes, tinnitus, difficulty speaking, facial droop, sore throat, chest pain, shortness of breath, abdominal pain, nausea/vomiting/diarrhea, dysuria, or numbness/paresthesias in any extremity   Physical Exam  Triage Vital Signs: ED Triage Vitals  Enc Vitals Group     BP 11/16/22 0650 (!) 209/95     Pulse Rate 11/16/22 0650 78     Resp 11/16/22 0650 15     Temp 11/16/22 0650 97.9 F (36.6 C)     Temp Source 11/16/22 0650 Oral     SpO2 11/16/22 0650 100 %     Weight --      Height 11/16/22 0653 '5\' 4"'$  (1.626 m)     Head Circumference --      Peak Flow --      Pain Score 11/16/22 0651 8     Pain Loc --      Pain Edu? --      Excl. in [place]? --    Most recent vital signs: Vitals:   11/16/22 0826 11/16/22 0830  BP: (!) 221/90 (!) 221/90  Pulse:  61  Resp:  16  Temp:    SpO2:  100%   General: Awake, oriented x4.  Urine odor CV:  Good peripheral perfusion.  Resp:  Normal effort.  Abd:  No distention.  Other:  Elderly overweight African-American female laying in bed no acute distress ED Results / Procedures / Treatments  Labs (all labs ordered are listed, but only abnormal results are displayed) Labs Reviewed  COMPREHENSIVE METABOLIC PANEL - Abnormal; Notable for the following components:      Result Value   Glucose,  Bld 153 (*)    Calcium 8.6 (*)    All other components within normal limits  CBC WITH DIFFERENTIAL/PLATELET - Abnormal; Notable for the following components:   RDW 15.8 (*)    All other components within normal limits  URINALYSIS, ROUTINE W REFLEX MICROSCOPIC - Abnormal; Notable for the following components:   Color, Urine YELLOW (*)    APPearance CLEAR (*)    Glucose, UA 50 (*)    Protein, ur 30 (*)    Bacteria, UA RARE (*)    All other components within normal limits  RESP PANEL BY RT-PCR (FLU A&B, COVID) [id number]   EKG ED ECG REPORT I, Naaman Plummer, the attending physician, personally viewed and interpreted this ECG. Date: 11/16/2022 EKG Time: 0657 Rate: 62 Rhythm: normal sinus rhythm QRS Axis: normal Intervals: normal ST/T Wave abnormalities: normal Narrative Interpretation: no evidence of acute ischemia RADIOLOGY ED MD interpretation: CT of the head without contrast interpreted by me shows no evidence of acute abnormalities including no intracerebral hemorrhage, obvious masses, or significant edema  CT of the cervical spine interpreted by me does not show any evidence of acute abnormalities including no acute fracture, malalignment, height loss,  or dislocation  One-view portable chest x-ray interpreted by me shows no evidence of acute abnormalities including no pneumonia, pneumothorax, or widened mediastinum -Agree with radiology assessment Official radiology report(s): CT Head Wo Contrast  Result Date: 11/16/2022 CLINICAL DATA:  Fall EXAM: CT HEAD WITHOUT CONTRAST CT CERVICAL SPINE WITHOUT CONTRAST TECHNIQUE: Multidetector CT imaging of the head and cervical spine was performed following the standard protocol without intravenous contrast. Multiplanar CT image reconstructions of the cervical spine were also generated. RADIATION DOSE REDUCTION: This exam was performed according to the departmental dose-optimization program which includes automated exposure control, adjustment  of the mA and/or kV according to patient size and/or use of iterative reconstruction technique. COMPARISON:  CT head and cervical spine 09/06/2022 FINDINGS: CT HEAD FINDINGS Brain: There is no acute intracranial hemorrhage, extra-axial fluid collection, or acute infarct. Parenchymal volume is normal for age. The ventricles are stable in size. Gray-white differentiation is preserved. Patchy and confluent hypodensity in the supratentorial white matter likely reflects sequela of chronic small-vessel ischemic change, similar to the prior study. There is no mass lesion.  There is no mass effect or midline shift. Vascular: Concern cavernous and verts Skull: Normal. Negative for fracture or focal lesion. Sinuses/Orbits: Imaged paranasal sinuses are clear. The imaged globes and orbits are unremarkable. Other: None. CT CERVICAL SPINE FINDINGS Alignment: There is unchanged slight reversal of the normal cervical lordosis. There is no antero or retrolisthesis. There is no jumped or perched facet or other evidence of traumatic malalignment. Skull base and vertebrae: Skull base alignment is maintained. Vertebral body heights are preserved. Soft tissues and spinal canal: No prevertebral fluid or swelling. No visible canal hematoma. Disc levels: Multilevel disc space narrowing, degenerative endplate change, and facet arthropathy is unchanged. There is no evidence of high-grade spinal canal stenosis. Upper chest: The ground-glass nodular opacity in the right upper lobe is unchanged since the prior CT chest; see recommendations on that report. Other: A right chest wall cardiac device is partially imaged. IMPRESSION: 1. Stable noncontrast head CT with no acute intracranial pathology. 2.  Acute fracture or traumatic malalignment of the cervical spine. Electronically Signed   By: Valetta Mole M.D.   On: 11/16/2022 08:23   CT Cervical Spine Wo Contrast  Result Date: 11/16/2022 CLINICAL DATA:  Fall EXAM: CT HEAD WITHOUT CONTRAST CT  CERVICAL SPINE WITHOUT CONTRAST TECHNIQUE: Multidetector CT imaging of the head and cervical spine was performed following the standard protocol without intravenous contrast. Multiplanar CT image reconstructions of the cervical spine were also generated. RADIATION DOSE REDUCTION: This exam was performed according to the departmental dose-optimization program which includes automated exposure control, adjustment of the mA and/or kV according to patient size and/or use of iterative reconstruction technique. COMPARISON:  CT head and cervical spine 09/06/2022 FINDINGS: CT HEAD FINDINGS Brain: There is no acute intracranial hemorrhage, extra-axial fluid collection, or acute infarct. Parenchymal volume is normal for age. The ventricles are stable in size. Gray-white differentiation is preserved. Patchy and confluent hypodensity in the supratentorial white matter likely reflects sequela of chronic small-vessel ischemic change, similar to the prior study. There is no mass lesion.  There is no mass effect or midline shift. Vascular: Concern cavernous and verts Skull: Normal. Negative for fracture or focal lesion. Sinuses/Orbits: Imaged paranasal sinuses are clear. The imaged globes and orbits are unremarkable. Other: None. CT CERVICAL SPINE FINDINGS Alignment: There is unchanged slight reversal of the normal cervical lordosis. There is no antero or retrolisthesis. There is no jumped or perched facet or  other evidence of traumatic malalignment. Skull base and vertebrae: Skull base alignment is maintained. Vertebral body heights are preserved. Soft tissues and spinal canal: No prevertebral fluid or swelling. No visible canal hematoma. Disc levels: Multilevel disc space narrowing, degenerative endplate change, and facet arthropathy is unchanged. There is no evidence of high-grade spinal canal stenosis. Upper chest: The ground-glass nodular opacity in the right upper lobe is unchanged since the prior CT chest; see  recommendations on that report. Other: A right chest wall cardiac device is partially imaged. IMPRESSION: 1. Stable noncontrast head CT with no acute intracranial pathology. 2.  Acute fracture or traumatic malalignment of the cervical spine. Electronically Signed   By: Valetta Mole M.D.   On: 11/16/2022 08:23   DG Chest Port 1 View  Result Date: 11/16/2022 CLINICAL DATA:  Chest pain, mechanical fall EXAM: PORTABLE CHEST 1 VIEW COMPARISON:  09/05/2022 FINDINGS: RIGHT pacer overlies normal cardiac silhouette. No effusion, infiltrate pneumothorax. No acute osseous abnormality. IMPRESSION: No acute cardiopulmonary process. Electronically Signed   By: Suzy Bouchard M.D.   On: 11/16/2022 08:07   PROCEDURES: Critical Care performed: No .1-3 Lead EKG Interpretation  Performed by: Naaman Plummer, MD Authorized by: Naaman Plummer, MD     Interpretation: normal     ECG rate:  62   ECG rate assessment: normal     Rhythm: sinus rhythm     Ectopy: none     Conduction: normal    MEDICATIONS ORDERED IN ED: Medications  labetalol (NORMODYNE) injection 10 mg (has no administration in time range)  sodium chloride 0.9 % bolus 1,000 mL (1,000 mLs Intravenous New Bag/Given 11/16/22 0832)  amLODipine (NORVASC) tablet 10 mg (10 mg Oral Given 11/16/22 0826)   IMPRESSION / MDM / ASSESSMENT AND PLAN / ED COURSE  I reviewed the triage vital signs and the nursing notes.                             The patient is on the cardiac monitor to evaluate for evidence of arrhythmia and/or significant heart rate changes. Patient's presentation is most consistent with acute presentation with potential threat to life or bodily function. Presenting after a fall that occurred just prior to arrival, resulting in injury to the left shoulder and posterior scalp. The mechanism of injury was a mechanical ground level fall without syncope or near-syncope. The current level of pain is moderate. There was no loss of  consciousness, confusion, seizure, or memory impairment. There is not a laceration associated with the injury. Denies neck pain. The patient does not take blood thinner medications. Denies vomiting, numbness/weakness, fever  Dispo: Discharge with PCP follow-up   FINAL CLINICAL IMPRESSION(S) / ED DIAGNOSES   Final diagnoses:  Fall, initial encounter  Acute pain of left shoulder  Contusion of scalp, initial encounter   Rx / DC Orders   ED Discharge Orders     None      Note:  This document was prepared using Dragon voice recognition software and may include unintentional dictation errors.   Naaman Plummer, MD 11/16/22 [phone]

## 2022-11-16 NOTE — ED Notes (Signed)
Family ar bedside to take pt home. No questions.

## 2022-12-16 ENCOUNTER — Telehealth: Payer: Self-pay

## 2022-12-16 NOTE — Telephone Encounter (Signed)
Received call 12/15/22 from daughter, Tara Sandoval. Ms. Shampine is currently in a hospital in [place]. She would like to cancel appointments here and she is to have them arranged in [place]. Appointments cancelled. Made her aware to let the hospital know to make follow up with Dr. Fransisca Connors in [facility] prior to her discharge.

## 2023-01-18 ENCOUNTER — Ambulatory Visit: Payer: Medicare Other

## 2023-01-25 ENCOUNTER — Ambulatory Visit: Payer: Medicare Other

## 2023-04-03 ENCOUNTER — Emergency Department
Admission: EM | Admit: 2023-04-03 | Discharge: 2023-04-04 | Disposition: A | Discharge status: Home / Self Care | Payer: Medicare Other | Attending: Emergency Medicine | Admitting: Emergency Medicine

## 2023-04-03 DIAGNOSIS — Z20822 Contact with and (suspected) exposure to covid-19: Secondary | ICD-10-CM | POA: Diagnosis not present

## 2023-04-03 DIAGNOSIS — Z7901 Long term (current) use of anticoagulants: Secondary | ICD-10-CM | POA: Diagnosis not present

## 2023-04-03 DIAGNOSIS — F0390 Unspecified dementia without behavioral disturbance: Secondary | ICD-10-CM | POA: Diagnosis not present

## 2023-04-03 DIAGNOSIS — J441 Chronic obstructive pulmonary disease with (acute) exacerbation: Secondary | ICD-10-CM | POA: Insufficient documentation

## 2023-04-03 DIAGNOSIS — R1900 Intra-abdominal and pelvic swelling, mass and lump, unspecified site: Secondary | ICD-10-CM

## 2023-04-03 DIAGNOSIS — R1909 Other intra-abdominal and pelvic swelling, mass and lump: Secondary | ICD-10-CM | POA: Insufficient documentation

## 2023-04-03 DIAGNOSIS — E119 Type 2 diabetes mellitus without complications: Secondary | ICD-10-CM | POA: Insufficient documentation

## 2023-04-03 DIAGNOSIS — W19XXXA Unspecified fall, initial encounter: Secondary | ICD-10-CM | POA: Diagnosis not present

## 2023-04-03 NOTE — ED Provider Notes (Signed)
[facility] Provider Note    Event Date/Time   First MD Initiated Contact with Patient 04/03/23 2354     (approximate)   History   Fall   HPI  Tara Sandoval is a 82 y.o. female who presents to the ED for evaluation of Fall   I review a December medical DC summary from [facility] where she was admitted for 1 week due to a COPD exacerbation and delirium.  History of DM, ovarian cyst with IR drain in place.  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  Dementia.  She was discharged to a SNF but apparently left the SNF to go back home with her daughter.  Patient presents to the ED from home via EMS from daughter's house for evaluation of a fall that occurred a couple days ago and increasing weakness since then.  Reportedly ambulatory with a walker.  Patient does not remember the fall as she is disoriented.  Apparently has been unable to walk at her baseline since then.    Physical Exam   Triage Vital Signs: ED Triage Vitals  Enc Vitals Group     BP      Pulse      Resp      Temp      Temp src      SpO2      Weight      Height      Head Circumference      Peak Flow      Pain Score      Pain Loc      Pain Edu?      Excl. in [place]?     Most recent vital signs: Vitals:   04/04/23 0323 04/04/23 0454  BP: (!) 161/74 (!) 174/79  Pulse: 67 78  Resp: (!) 22 20  Temp:    SpO2: 96% 95%    General: Awake, no distress.  CV:  Good peripheral perfusion.  Resp:  Normal effort.  Abd:  No distention.  Drain tube coming from the lower mid abdomen with clean insertion site.  Abdomen is soft but mildly tender throughout. MSK:  No deformity noted.  Neuro:  No focal deficits appreciated. Other:     ED Results / Procedures / Treatments   Labs (all labs ordered are listed, but only abnormal results are displayed) Labs Reviewed  COMPREHENSIVE METABOLIC PANEL - Abnormal; Notable for the following components:      Result Value   Glucose, Bld 212  (*)    Calcium 8.3 (*)    Albumin 3.1 (*)    All other components within normal limits  CBC WITH DIFFERENTIAL/PLATELET - Abnormal; Notable for the following components:   RBC 5.15 (*)    Platelets 459 (*)    All other components within normal limits  URINALYSIS, ROUTINE W REFLEX MICROSCOPIC - Abnormal; Notable for the following components:   Color, Urine YELLOW (*)    APPearance HAZY (*)    Protein, ur 100 (*)    All other components within normal limits  RESP PANEL BY RT-PCR (RSV, FLU A&B, COVID)  [id number]    EKG Ventricularly paced rhythm with a rate of 65 bpm.  Appropriate axis and intervals with an LBBB morphology and no signs of STEMI by Sgarbossa criteria.  RADIOLOGY CXR interpreted by me without evidence of acute cardiopulmonary pathology. CT head interpreted by me without evidence of acute intracranial pathology Plain film the pelvis and left hip interpreted  by me without evidence of fracture or dislocation  Official radiology report(s): CT ABDOMEN PELVIS W CONTRAST  Result Date: 04/04/2023 CLINICAL DATA:  History of large cystic pelvic mass, initial encounter EXAM: CT ABDOMEN AND PELVIS WITH CONTRAST TECHNIQUE: Multidetector CT imaging of the abdomen and pelvis was performed using the standard protocol following bolus administration of intravenous contrast. RADIATION DOSE REDUCTION: This exam was performed according to the departmental dose-optimization program which includes automated exposure control, adjustment of the mA and/or kV according to patient size and/or use of iterative reconstruction technique. CONTRAST:  100mL OMNIPAQUE IOHEXOL 300 MG/ML  SOLN COMPARISON:  11/30/2022, by report from 12/13/2022 FINDINGS: Lower chest: No acute abnormality. Stable 6 mm nodule is noted in the left lower lobe seen on the first image. Hepatobiliary: Fatty infiltration of the liver is noted. Gallbladder is within normal limits. Pancreas: Unremarkable. No pancreatic ductal dilatation or  surrounding inflammatory changes. Spleen: Normal in size without focal abnormality. Adrenals/Urinary Tract: Adrenal glands are within normal limits. Kidneys demonstrate a normal enhancement pattern bilaterally. 1-2 mm right renal stone is noted. No obstructive changes are seen. Simple appearing cyst is noted within the right kidney measuring 3.3 cm. No follow-up is recommended. This is stable from the prior study. The bladder is decompressed. Stomach/Bowel: No obstructive or inflammatory changes of the colon are seen. The appendix is not discretely visualized. No inflammatory changes are noted. Small bowel and stomach appear within normal limits. Vascular/Lymphatic: Aortic atherosclerosis. No enlarged abdominal or pelvic lymph nodes. Reproductive: Uterus is not discretely visualized and may have been surgically removed. Correlate with clinical history. Large multiloculated cyst is noted arising from the pelvis measuring up to 18 by 11 cm in greatest transverse and AP dimensions respectively. It extends in the craniocaudad projection for approximately 14 cm. The septa demonstrates some mild enhancement. Pigtail catheter is noted within the previously seen largest central component. This has decompressed and is no longer well seen. Catheter loop is noted within the decompressed lumen. The multiple surrounding cystic structures likely do not intercommunicate well given their persistence despite central drain placement. Other: No abdominal wall hernia or abnormality. No abdominopelvic ascites. Musculoskeletal: No acute or significant osseous findings. IMPRESSION: Large multiloculated cystic mass arising from the pelvis. The previously seen central large cyst has resolved following pigtail catheter placement. The surrounding locules however likely do not inter communicate well given their persistence despite satisfactory drain placement. Nonobstructing right renal stone. Stable 6 mm nodule in the left lower lobe. Future  CT at 12-18 months (from today's scan) is considered optional for low-risk patients, but is recommended for high-risk patients. This recommendation follows the consensus statement: Guidelines for Management of Incidental Pulmonary Nodules Detected on CT Images: From the Fleischner Society 2017; Radiology 2017; 284:228-243. Electronically Signed   By: Alcide CleverMark  Lukens M.D.   On: 04/04/2023 03:33   DG Chest Portable 1 View  Result Date: 04/04/2023 CLINICAL DATA:  Status post fall. EXAM: PORTABLE CHEST 1 VIEW COMPARISON:  November 30, 2022 FINDINGS: There is stable dual lead AICD positioning. The cardiac silhouette is mildly enlarged and unchanged in size. Moderate severity calcification of the aortic arch is noted. There is no evidence of an acute infiltrate, pleural effusion or pneumothorax. The visualized skeletal structures are unremarkable. IMPRESSION: Stable cardiomegaly without evidence of acute or active cardiopulmonary disease. Electronically Signed   By: Aram Candelahaddeus  Houston M.D.   On: 04/04/2023 01:10   DG HIP UNILAT WITH PELVIS 2-3 VIEWS LEFT  Result Date: 04/04/2023 CLINICAL DATA:  Status  post fall. EXAM: DG HIP (WITH OR WITHOUT PELVIS) 2-3V LEFT COMPARISON:  None Available. FINDINGS: There is no evidence of an acute hip fracture or dislocation. Moderate severity degenerative changes seen involving both hips in the form of joint space narrowing and acetabular sclerosis. Additional degenerative changes are seen involving the bilateral sacroiliac joints IMPRESSION: Moderate severity degenerative changes without evidence of acute osseous abnormality. Electronically Signed   By: Aram Candela M.D.   On: 04/04/2023 01:05   CT Cervical Spine Wo Contrast  Result Date: 04/04/2023 CLINICAL DATA:  Status post fall. EXAM: CT CERVICAL SPINE WITHOUT CONTRAST TECHNIQUE: Multidetector CT imaging of the cervical spine was performed without intravenous contrast. Multiplanar CT image reconstructions were also  generated. RADIATION DOSE REDUCTION: This exam was performed according to the departmental dose-optimization program which includes automated exposure control, adjustment of the mA and/or kV according to patient size and/or use of iterative reconstruction technique. COMPARISON:  November 16, 2022 FINDINGS: Alignment: There is straightening of the normal cervical spine lordosis. Skull base and vertebrae: No acute fracture. A chronic deformity is seen involving the tip of the dens. This is present on the prior study. Soft tissues and spinal canal: No prevertebral fluid or swelling. No visible canal hematoma. Disc levels: Moderate to marked severity endplate sclerosis, marked severity anterior osteophyte formation and moderate severity posterior bony spurring are seen at the levels of C4-C5, C5-C6 and C6-C7. There is marked severity narrowing of the anterior atlantoaxial articulation. Marked severity intervertebral disc space narrowing is seen at the levels of C4-C5, C5-C6, C6-C7 and C7-T1. Bilateral moderate severity multilevel facet joint hypertrophy is noted. Upper chest: Negative. Other: None. IMPRESSION: 1. No acute fracture or subluxation in the cervical spine. 2. Chronic deformity involving the tip of the dens. 3. Marked severity multilevel degenerative changes, as described above. Electronically Signed   By: Aram Candela M.D.   On: 04/04/2023 00:41   CT HEAD WO CONTRAST ( )  Result Date: 04/04/2023 CLINICAL DATA:  Status post fall. EXAM: CT HEAD WITHOUT CONTRAST TECHNIQUE: Contiguous axial images were obtained from the base of the skull through the vertex without intravenous contrast. RADIATION DOSE REDUCTION: This exam was performed according to the departmental dose-optimization program which includes automated exposure control, adjustment of the mA and/or kV according to patient size and/or use of iterative reconstruction technique. COMPARISON:  November 30, 2022 FINDINGS: Brain: There is mild  cerebral atrophy with widening of the extra-axial spaces and ventricular dilatation. There are moderate severity areas of decreased attenuation within the white matter tracts of the supratentorial brain, consistent with microvascular disease changes. 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 Vascular: No hyperdense vessel or unexpected calcification. Skull: Normal. Negative for fracture or focal lesion. Sinuses/Orbits: A 2.1 cm x 1.4 cm left maxillary sinus polyp versus mucous retention cyst is seen. Other: N/A IMPRESSION: 1. No acute intracranial abnormality. 2. Generalized cerebral atrophy and moderate severity microvascular disease changes of the supratentorial brain. 3. 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 4. Left maxillary sinus polyp versus mucous retention cyst. Electronically Signed   By: Aram Candela M.D.   On: 04/04/2023 00:38    PROCEDURES and INTERVENTIONS:  Procedures  Medications  iohexol (OMNIPAQUE) 300 MG/ML solution 100 mL (100 mLs Intravenous Contrast Given 04/04/23 0311)     IMPRESSION / MDM / ASSESSMENT AND PLAN / ED COURSE  I reviewed the triage vital signs and the nursing notes.  Differential diagnosis includes, but is not limited to, pelvic fracture,  hip fracture, UTI, sepsis, AKI or dehydration.  Electrolyte derangement.  {Patient presents with symptoms of an acute illness or injury that is potentially life-threatening.  82 year old woman with dementia presents from home after a fall without evidence of significant acute derangements and suitable for return to outpatient management with family.  She is pleasantly disoriented and has no complaints.  She has a reassuring examination without evidence of significant trauma, neurologic or vascular deficits.  She has some minimal and diffuse abdominal tenderness with an indwelling drain in place.  Blood work with an essentially normal CBC and metabolic panel.  Urine without infectious  features.  Viral swab is negative.  Imaging is negative, as above, including abdomen/pelvis with a known multiloculated cystic mass that remains despite a JP drain draining 1 of these loculations or septations.  I speak with the daughter over the phone and the daughter arrives in the ED and is comfortable taking the patient back home and following up with various specialists and PCP as an outpatient.  We discussed return precautions.  Clinical Course as of 04/04/23 1308  Tue Apr 04, 2023  0024 I call daughter, August Graham, no response. VM full. [DS]  0234 Called August again. When she came home, drain tube from stomach was leaking around skin site.  Reports clear exudate from around this skin insertion site.  No other concerns.  She is comfortable taking the patient back home if we have a reassuring workup [DS]    Clinical Course User Index [DS] Delton Prairie, MD     FINAL CLINICAL IMPRESSION(S) / ED DIAGNOSES   Final diagnoses:  Fall, initial encounter  Pelvic mass in female     Rx / DC Orders   ED Discharge Orders     None        Note:  This document was prepared using Dragon voice recognition software and may include unintentional dictation errors.   Delton Prairie, MD 04/04/23 [phone]

## 2023-04-04 ENCOUNTER — Emergency Department: Payer: Medicare Other

## 2023-04-04 ENCOUNTER — Other Ambulatory Visit: Payer: Self-pay

## 2023-04-04 DIAGNOSIS — R1909 Other intra-abdominal and pelvic swelling, mass and lump: Secondary | ICD-10-CM | POA: Diagnosis not present

## 2023-04-04 LAB — URINALYSIS, ROUTINE W REFLEX MICROSCOPIC
Bacteria, UA: NONE SEEN
Bilirubin Urine: NEGATIVE
Glucose, UA: NEGATIVE mg/dL
Hgb urine dipstick: NEGATIVE
Ketones, ur: NEGATIVE mg/dL
Leukocytes,Ua: NEGATIVE
Nitrite: NEGATIVE
Protein, ur: 100 mg/dL — AB
Specific Gravity, Urine: 1.024 (ref 1.005–1.030)
pH: 5 (ref 5.0–8.0)

## 2023-04-04 LAB — CBC WITH DIFFERENTIAL/PLATELET
Abs Immature Granulocytes: 0.02 10*3/uL (ref 0.00–0.07)
Basophils Absolute: 0 10*3/uL (ref 0.0–0.1)
Basophils Relative: 1 %
Eosinophils Absolute: 0.1 10*3/uL (ref 0.0–0.5)
Eosinophils Relative: 2 %
HCT: 44.2 % (ref 36.0–46.0)
Hemoglobin: 13.7 g/dL (ref 12.0–15.0)
Immature Granulocytes: 0 %
Lymphocytes Relative: 21 %
Lymphs Abs: 1.5 10*3/uL (ref 0.7–4.0)
MCH: 26.6 pg (ref 26.0–34.0)
MCHC: 31 g/dL (ref 30.0–36.0)
MCV: 85.8 fL (ref 80.0–100.0)
Monocytes Absolute: 0.8 10*3/uL (ref 0.1–1.0)
Monocytes Relative: 11 %
Neutro Abs: 4.4 10*3/uL (ref 1.7–7.7)
Neutrophils Relative %: 65 %
Platelets: 459 10*3/uL — ABNORMAL HIGH (ref 150–400)
RBC: 5.15 MIL/uL — ABNORMAL HIGH (ref 3.87–5.11)
RDW: 14.2 % (ref 11.5–15.5)
WBC: 6.8 10*3/uL (ref 4.0–10.5)
nRBC: 0 % (ref 0.0–0.2)

## 2023-04-04 LAB — COMPREHENSIVE METABOLIC PANEL
ALT: 6 U/L (ref 0–44)
AST: 15 U/L (ref 15–41)
Albumin: 3.1 g/dL — ABNORMAL LOW (ref 3.5–5.0)
Alkaline Phosphatase: 47 U/L (ref 38–126)
Anion gap: 7 (ref 5–15)
BUN: 13 mg/dL (ref 8–23)
CO2: 28 mmol/L (ref 22–32)
Calcium: 8.3 mg/dL — ABNORMAL LOW (ref 8.9–10.3)
Chloride: 100 mmol/L (ref 98–111)
Creatinine, Ser: 0.9 mg/dL (ref 0.44–1.00)
GFR, Estimated: >60 mL/min (ref >60)
Glucose, Bld: 212 mg/dL — ABNORMAL HIGH (ref 70–99)
Potassium: 3.7 mmol/L (ref 3.5–5.1)
Sodium: 135 mmol/L (ref 135–145)
Total Bilirubin: 0.7 mg/dL (ref 0.3–1.2)
Total Protein: 7.4 g/dL (ref 6.5–8.1)

## 2023-04-04 LAB — RESP PANEL BY RT-PCR (RSV, FLU A&B, COVID)  RVPGX2
Influenza A by PCR: NEGATIVE
Influenza B by PCR: NEGATIVE
Resp Syncytial Virus by PCR: NEGATIVE
SARS Coronavirus 2 by RT PCR: NEGATIVE

## 2023-04-04 MED ORDER — IOHEXOL 300 MG/ML  SOLN
100.0000 mL | Freq: Once | INTRAMUSCULAR | Status: AC | PRN
  Administered 2023-04-04: 100 mL via INTRAVENOUS

## 2023-04-04 NOTE — ED Notes (Signed)
Dr. Smith at bedside.

## 2023-04-04 NOTE — Discharge Instructions (Addendum)
No signs of significant injuries from her fall.   She does still have a large cyst in her pelvis.  The draining catheter is in the same place as it was before but does not seem to be draining adjacent cysts as there may be separations between them.  Use Tylenol for pain and fevers.  Up to 1000 mg per dose, up to 4 times per day.  Do not take more than 4000 mg of Tylenol/acetaminophen within 24 hours.Marland Kitchen

## 2023-04-04 NOTE — ED Triage Notes (Signed)
Pt comes from home (lives with daughter) after having a mechanical fall x 2 days ago. Family reported to EMS that pt has decreased mobility and appetite since fall. EMS did report that pt was ambulatory with walker assistance upon their arrival. Pt reports pain in left pelvis region. Pt has dementia and is alert and oriented to place only.

## 2023-04-04 NOTE — ED Notes (Signed)
Daughter states she is on her way to pick up patient.

## 2023-04-15 ENCOUNTER — Emergency Department: Payer: Medicare Other

## 2023-04-15 ENCOUNTER — Emergency Department
Admission: EM | Admit: 2023-04-15 | Discharge: 2023-04-15 | Disposition: A | Discharge status: Home / Self Care | Payer: Medicare Other | Attending: Emergency Medicine | Admitting: Emergency Medicine

## 2023-04-15 DIAGNOSIS — Y92009 Unspecified place in unspecified non-institutional (private) residence as the place of occurrence of the external cause: Secondary | ICD-10-CM | POA: Diagnosis not present

## 2023-04-15 DIAGNOSIS — F0390 Unspecified dementia without behavioral disturbance: Secondary | ICD-10-CM | POA: Insufficient documentation

## 2023-04-15 DIAGNOSIS — Z7901 Long term (current) use of anticoagulants: Secondary | ICD-10-CM | POA: Insufficient documentation

## 2023-04-15 DIAGNOSIS — J449 Chronic obstructive pulmonary disease, unspecified: Secondary | ICD-10-CM | POA: Diagnosis not present

## 2023-04-15 DIAGNOSIS — I4891 Unspecified atrial fibrillation: Secondary | ICD-10-CM | POA: Insufficient documentation

## 2023-04-15 DIAGNOSIS — Z043 Encounter for examination and observation following other accident: Secondary | ICD-10-CM | POA: Insufficient documentation

## 2023-04-15 DIAGNOSIS — W19XXXA Unspecified fall, initial encounter: Secondary | ICD-10-CM | POA: Insufficient documentation

## 2023-04-15 DIAGNOSIS — Z950 Presence of cardiac pacemaker: Secondary | ICD-10-CM | POA: Diagnosis not present

## 2023-04-15 LAB — CBC
HCT: 46.1 % — ABNORMAL HIGH (ref 36.0–46.0)
Hemoglobin: 14.5 g/dL (ref 12.0–15.0)
MCH: 26.1 pg (ref 26.0–34.0)
MCHC: 31.5 g/dL (ref 30.0–36.0)
MCV: 82.9 fL (ref 80.0–100.0)
Platelets: 527 10*3/uL — ABNORMAL HIGH (ref 150–400)
RBC: 5.56 MIL/uL — ABNORMAL HIGH (ref 3.87–5.11)
RDW: 14.7 % (ref 11.5–15.5)
WBC: 11.2 10*3/uL — ABNORMAL HIGH (ref 4.0–10.5)
nRBC: 0 % (ref 0.0–0.2)

## 2023-04-15 LAB — URINALYSIS, W/ REFLEX TO CULTURE (INFECTION SUSPECTED)
Bilirubin Urine: NEGATIVE
Glucose, UA: NEGATIVE mg/dL
Hgb urine dipstick: NEGATIVE
Ketones, ur: NEGATIVE mg/dL
Leukocytes,Ua: NEGATIVE
Nitrite: NEGATIVE
Protein, ur: 100 mg/dL — AB
Specific Gravity, Urine: 1.026 (ref 1.005–1.030)
pH: 5 (ref 5.0–8.0)

## 2023-04-15 LAB — CK: Total CK: 44 U/L (ref 38–234)

## 2023-04-15 LAB — BASIC METABOLIC PANEL
Anion gap: 9 (ref 5–15)
BUN: 17 mg/dL (ref 8–23)
CO2: 26 mmol/L (ref 22–32)
Calcium: 8.2 mg/dL — ABNORMAL LOW (ref 8.9–10.3)
Chloride: 97 mmol/L — ABNORMAL LOW (ref 98–111)
Creatinine, Ser: 1.05 mg/dL — ABNORMAL HIGH (ref 0.44–1.00)
GFR, Estimated: 53 mL/min — ABNORMAL LOW (ref >60)
Glucose, Bld: 248 mg/dL — ABNORMAL HIGH (ref 70–99)
Potassium: 4.4 mmol/L (ref 3.5–5.1)
Sodium: 132 mmol/L — ABNORMAL LOW (ref 135–145)

## 2023-04-15 LAB — TROPONIN I (HIGH SENSITIVITY): Troponin I (High Sensitivity): 14 ng/L (ref <18)

## 2023-04-15 MED ORDER — SODIUM CHLORIDE 0.9 % IV BOLUS
500.0000 mL | Freq: Once | INTRAVENOUS | Status: AC
  Administered 2023-04-15: 500 mL via INTRAVENOUS

## 2023-04-15 NOTE — ED Provider Notes (Addendum)
[facility] Provider Note    Event Date/Time   First MD Initiated Contact with Patient 04/15/23 [phone]     (approximate)   History   Fall   HPI  Tara Sandoval is a 82 y.o. female past medical history significant for COPD, dementia, ovarian cyst with IR drain in place, chronic DVT and atrial fibrillation on Eliquis, sick sinus syndrome with pacemaker, who presents to the emergency department following unwitnessed fall.  Family found her this morning and last saw her last night.  Patient without complaints at this time.  Is on anticoagulation.  Patient does not recall the events of the fall.  Denies any chest pain or shortness of breath.  Denies any dysuria, urinary urgency or frequency.  Denies nausea or vomiting.  Denies a headache or neck pain.  No back pain.     Physical Exam   Triage Vital Signs: ED Triage Vitals  Enc Vitals Group     BP 04/15/23 0711 (!) 156/74     Pulse Rate 04/15/23 0711 66     Resp --      Temp --      Temp Source 04/15/23 0711 Oral     SpO2 04/15/23 0711 98 %     Weight --      Height --      Head Circumference --      Peak Flow --      Pain Score 04/15/23 0712 0     Pain Loc --      Pain Edu? --      Excl. in GC? --     Most recent vital signs: Vitals:   04/15/23 1030 04/15/23 1045  BP: 123/71   Pulse: 65 65  Resp: 16 18  Temp: 98.3 F (36.8 C)   SpO2: 97% 96%    Physical Exam Vitals and nursing note reviewed.  Constitutional:      Appearance: She is well-developed.  HENT:     Head: Normocephalic and atraumatic.     Right Ear: External ear normal.     Left Ear: External ear normal.  Eyes:     Extraocular Movements: Extraocular movements intact.     Conjunctiva/sclera: Conjunctivae normal.     Pupils: Pupils are equal, round, and reactive to light.  Cardiovascular:     Rate and Rhythm: Normal rate and regular rhythm.  Pulmonary:     Effort: Pulmonary effort is normal.     Breath sounds: Normal breath  sounds.  Abdominal:     General: There is no distension.     Palpations: Abdomen is soft.     Tenderness: There is no abdominal tenderness.     Comments: IR drain in place  Musculoskeletal:        General: No swelling or tenderness. Normal range of motion.     Cervical back: Neck supple. No tenderness.     Comments: No tenderness to the pelvis, able to raise bilateral lower extremities up to gravity.  No tenderness to bilateral upper extremities.  Skin:    General: Skin is warm and dry.     Capillary Refill: Capillary refill takes less than 2 seconds.  Neurological:     General: No focal deficit present.     Mental Status: She is alert.  Psychiatric:        Mood and Affect: Mood normal.     IMPRESSION / MDM / ASSESSMENT AND PLAN / ED COURSE  I reviewed the triage vital  signs and the nursing notes.  Differential diagnosis including infectious process, dehydration, electrolyte abnormality, intracranial hemorrhage, rhabdomyolysis.  On chart review had a recent fall and a recent hospitalization for COPD.  EKG  I, Corena Herter, the attending physician, personally viewed and interpreted this ECG.  Paced rhythm with multiple PVCs, wandering baseline, negative for STEMI based on Sgarbossa's criteria.  More frequent PVCs when compared to prior EKG.  RADIOLOGY I independently reviewed imaging, my interpretation of imaging: CT head, CT cervical spine, chest x-ray -no sign of intracranial hemorrhage or infarction.  No signs of cervical spine fracture.  Chest x-ray with no signs of pneumonia, fracture or pneumothorax.  Read as no acute findings.  LABS (all labs ordered are listed, but only abnormal results are displayed) Labs interpreted as -    Labs Reviewed  BASIC METABOLIC PANEL - Abnormal; Notable for the following components:      Result Value   Sodium 132 (*)    Chloride 97 (*)    Glucose, Bld 248 (*)    Creatinine, Ser 1.05 (*)    Calcium 8.2 (*)    GFR, Estimated 53 (*)     All other components within normal limits  CBC - Abnormal; Notable for the following components:   WBC 11.2 (*)    RBC 5.56 (*)    HCT 46.1 (*)    Platelets 527 (*)    All other components within normal limits  URINALYSIS, W/ REFLEX TO CULTURE (INFECTION SUSPECTED) - Abnormal; Notable for the following components:   Color, Urine AMBER (*)    APPearance CLOUDY (*)    Protein, ur 100 (*)    Bacteria, UA RARE (*)    All other components within normal limits  CK  TROPONIN I (HIGH SENSITIVITY)     MDM  CK within normal limits, clinical picture is not consistent with rhabdomyolysis.  Low suspicion for ACS or dysrhythmia, no chest pain, troponin is negative.  Mild hyponatremia.  No significant electrolyte abnormalities otherwise.  Creatinine appears close to her baseline.  CT imaging without findings of traumatic injury.  No obvious infectious source.  In and out for urine.  Given a 500 bolus of normal saline for likely mild dehydration.  Attempted to call the patient's daughter x 2  nurse also attempted to call - goes to voicemail.     11:52 AM Patient's daughter called back, nurse reviewed all of her paperwork.  Daughter stated that she could not come and pick her up because she does not walk.  Will attempt to find a ride for the patient to go back home.  Patient is currently under the care of hospice.  PROCEDURES:  Critical Care performed: No  Procedures  Patient's presentation is most consistent with acute presentation with potential threat to life or bodily function.   MEDICATIONS ORDERED IN ED: Medications  sodium chloride 0.9 % bolus 500 mL (0 mLs Intravenous Stopped 04/15/23 1058)    FINAL CLINICAL IMPRESSION(S) / ED DIAGNOSES   Final diagnoses:  Fall, initial encounter     Rx / DC Orders   ED Discharge Orders     None        Note:  This document was prepared using Dragon voice recognition software and may include unintentional dictation errors.    Corena Herter, MD 04/15/23 1151    Corena Herter, MD 04/15/23 1152    Corena Herter, MD 04/15/23 1159

## 2023-04-15 NOTE — ED Triage Notes (Signed)
Patient BIB [facility] from home. Family found patient on the floor by bed  by family. Unwitnessed fall. Patient is on hospice. No c/o pain at this time.

## 2023-04-15 NOTE — ED Notes (Signed)
Spoke with daughter August. Pt is unable to walk and will need transport back home. Family will be waiting her arrival.  August Roat [phone]

## 2023-04-15 NOTE — Discharge Instructions (Addendum)
You were seen in the emergency department after a fall.  Your lab work was overall normal.  You appeared mildly dehydrated so you were given IV fluids.  It is importantly stay hydrated and drink plenty of fluids.  Follow-up closely with your primary care physician.  No signs of urinary tract infection  CT scans and x-rays without traumatic injury.  She did not have a urinary tract infection.

## 2023-05-27 DEATH — deceased

## 2023-09-16 IMAGING — CR DG FEMUR 2+V*L*
1 series · 4 of 4 positions shown · non-contrast
Comparison: None.

CLINICAL DATA: Pain after injury

EXAM:
LEFT FEMUR 2 VIEWS

[Series 1: dg femur min 2 views left · 0.14mm/px · 4 of 4 slices shown]
[im 1/4]
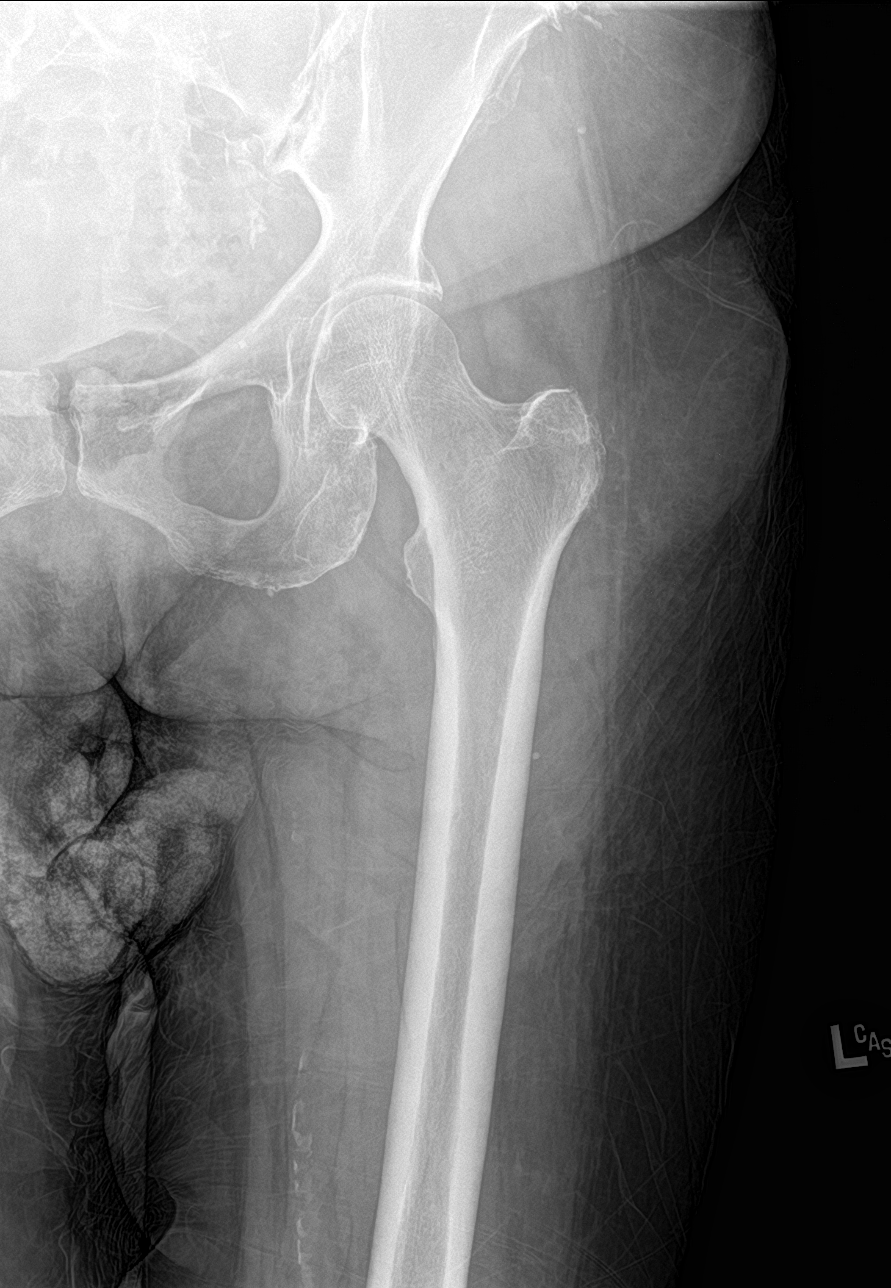
[im 2/4]
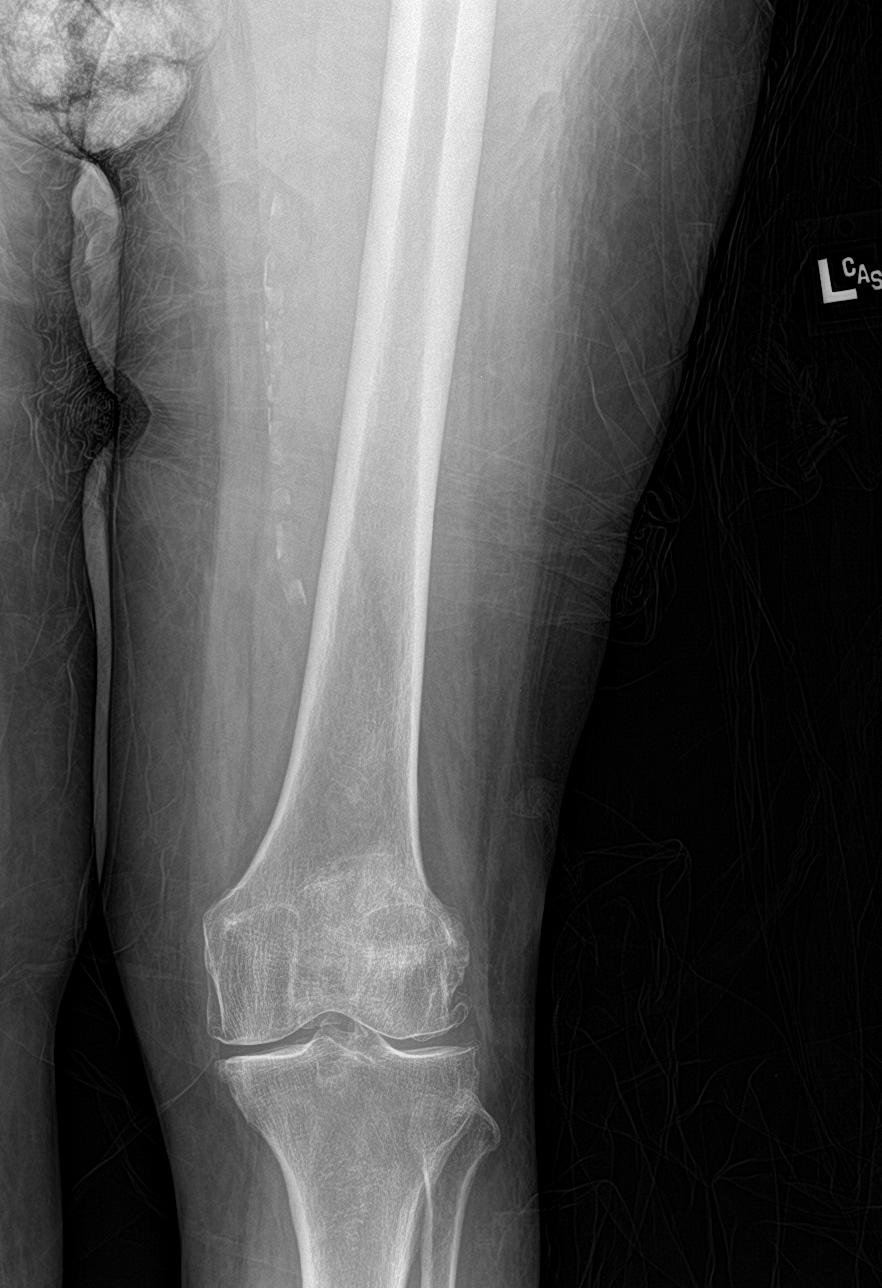
[im 3/4]
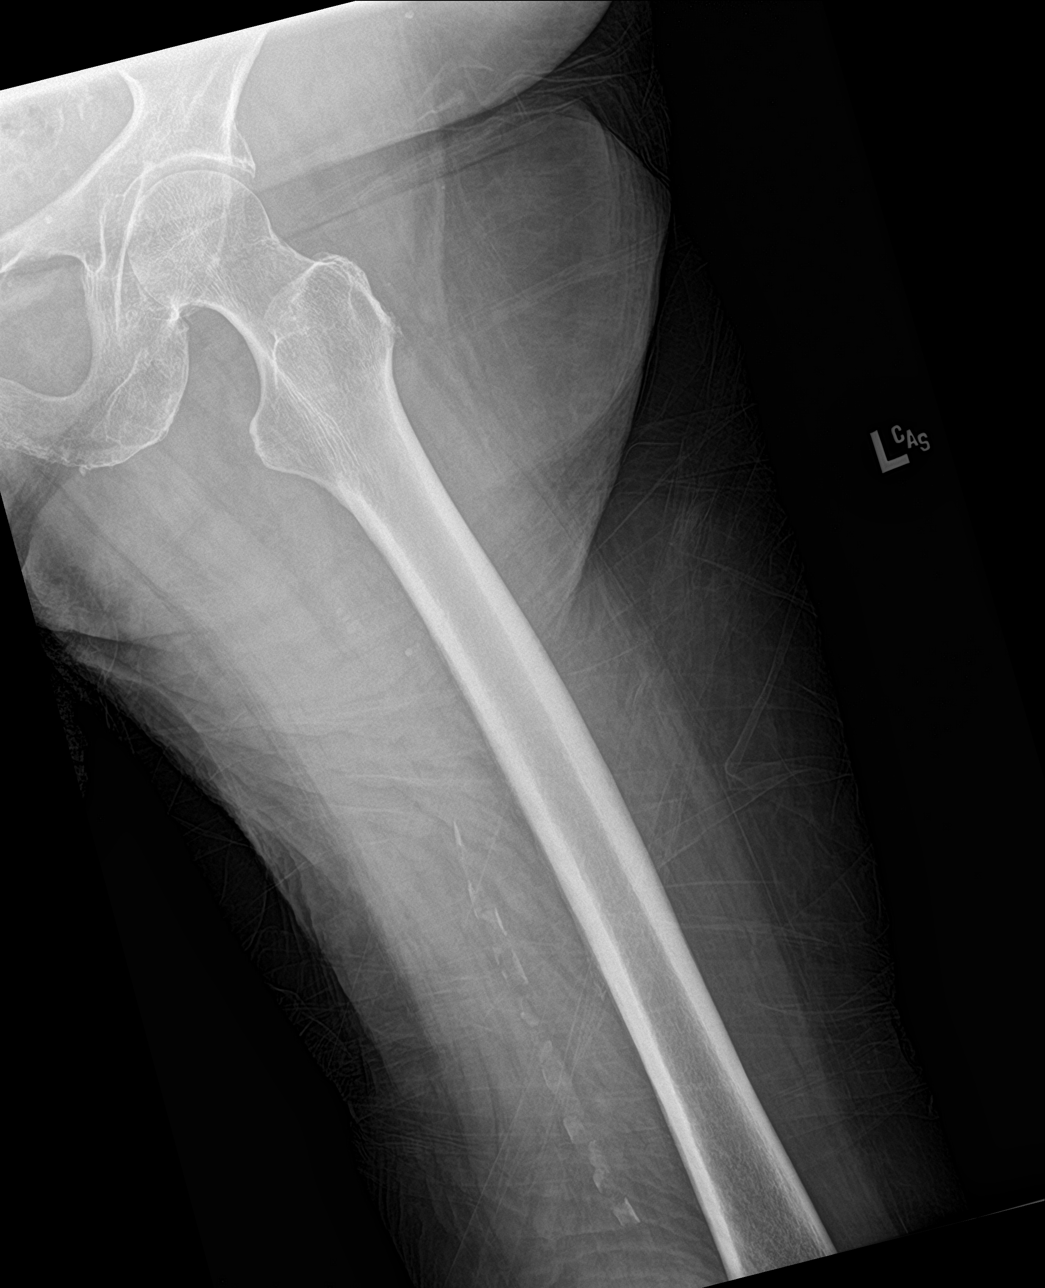
[im 4/4]
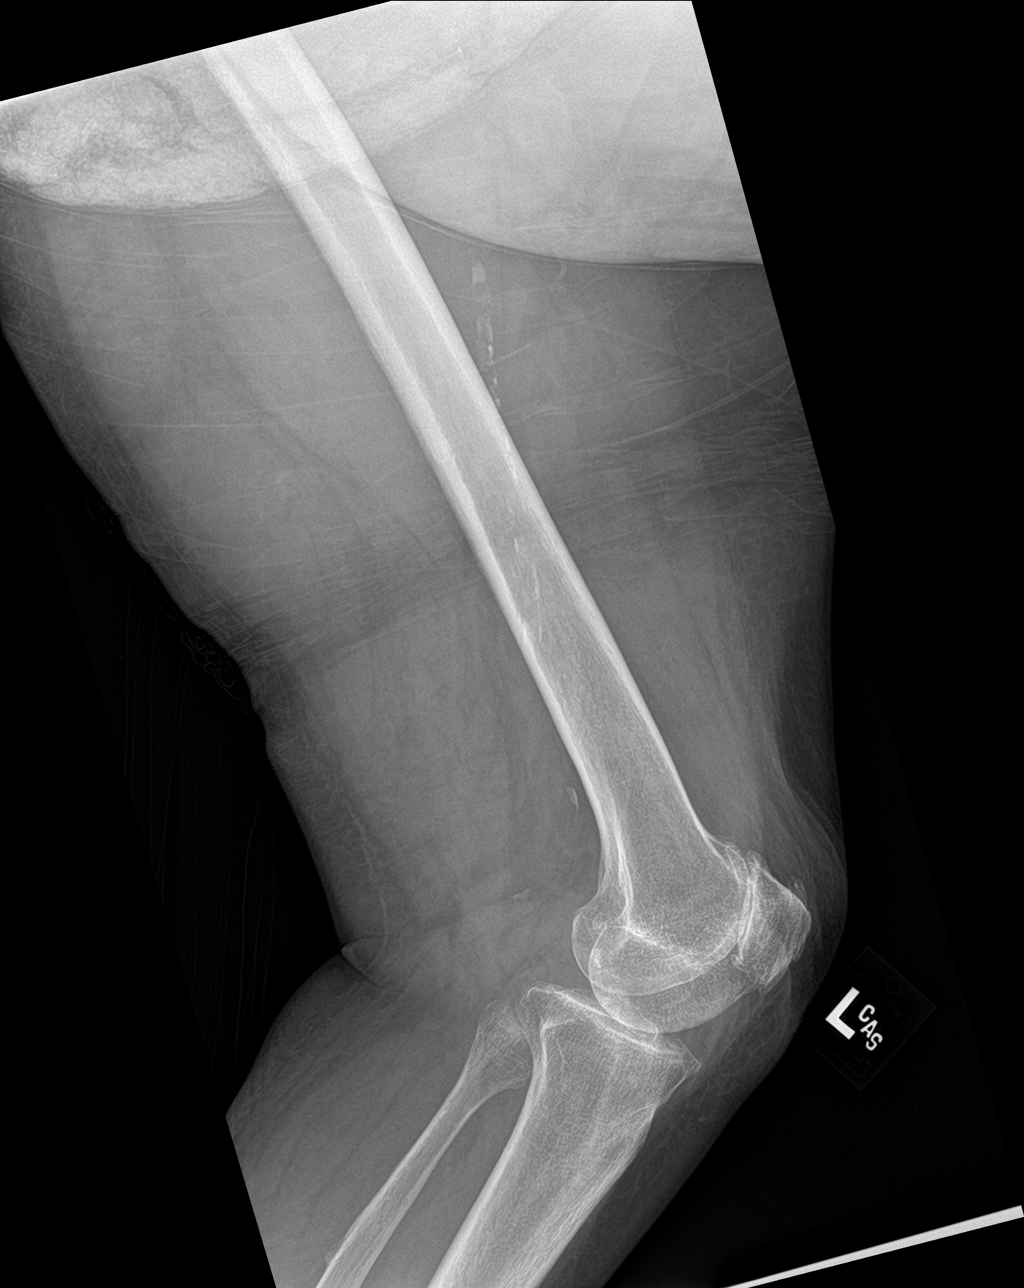

[4 of 4 positions shown; findings below may reference images not displayed]

FINDINGS: There is no evidence of acute fracture or dislocation.
Tricompartment osteoarthritis of the knee. Vascular calcifications.
Mild left hip osteoarthritis.
IMPRESSION: No acute osseous abnormality.

## 2023-09-16 IMAGING — CR DG TIBIA/FIBULA 2V*L*
1 series · 4 of 4 positions shown · non-contrast
Comparison: None.

CLINICAL DATA: pain after injury

EXAM:
LEFT TIBIA AND FIBULA - 2 VIEW

[Series 1: dg tibia/fibula left · 0.14mm/px · 4 of 4 slices shown]
[im 1/4]
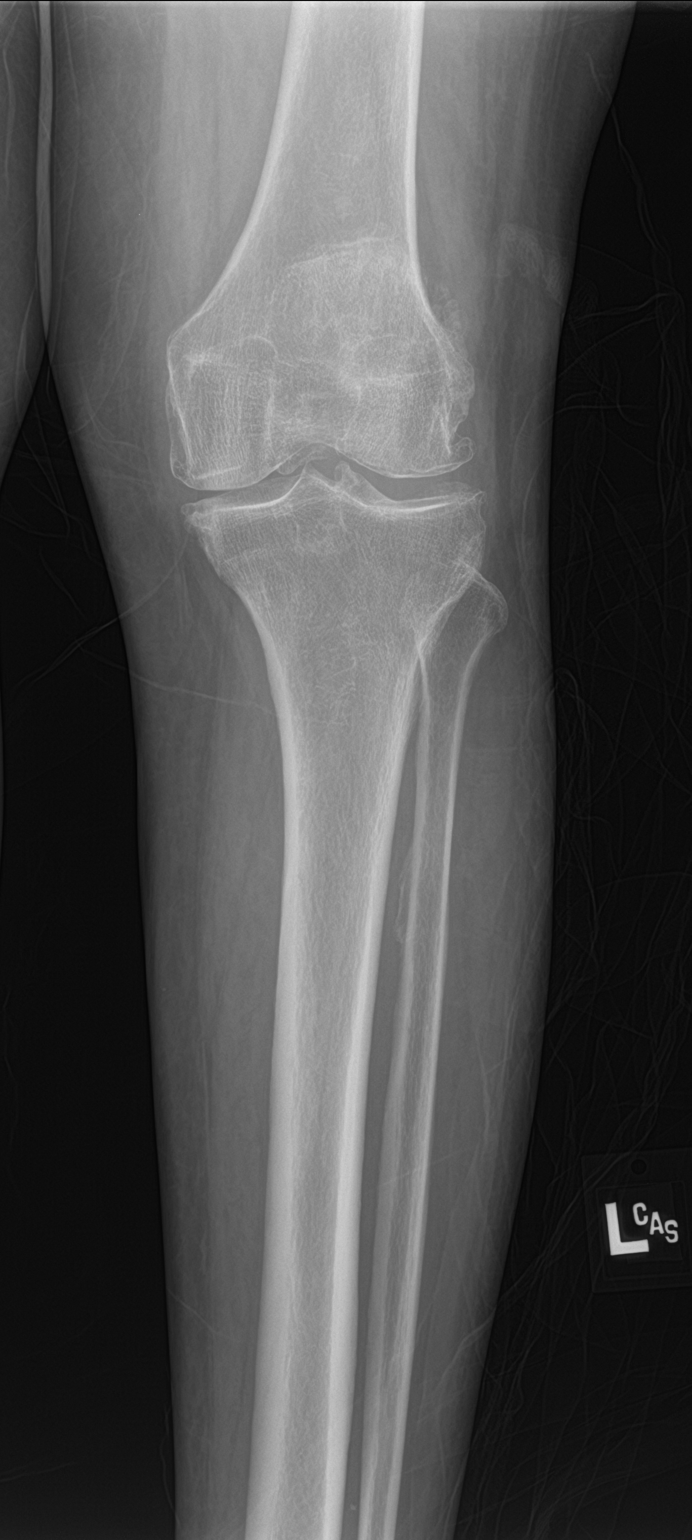
[im 2/4]
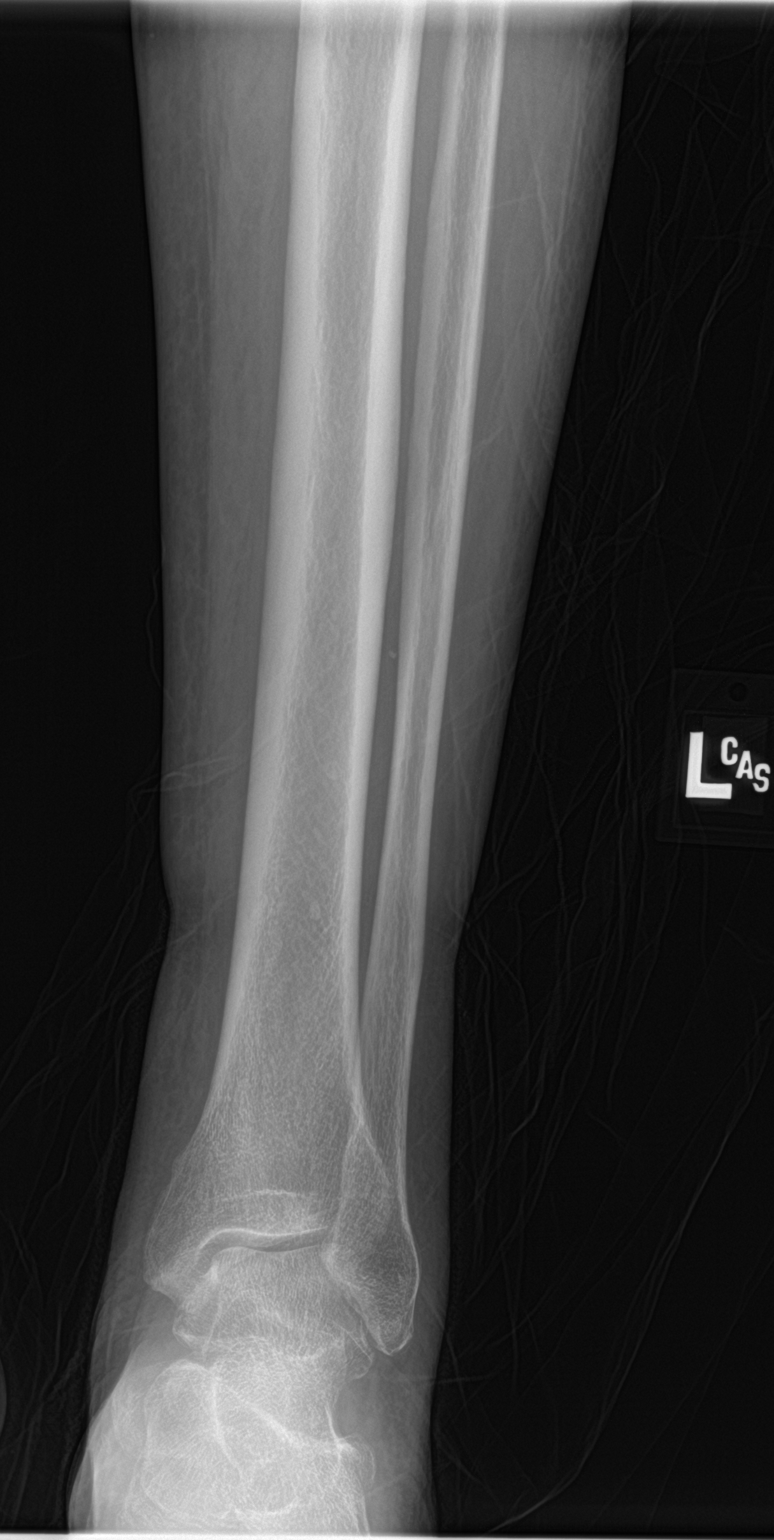
[im 3/4]
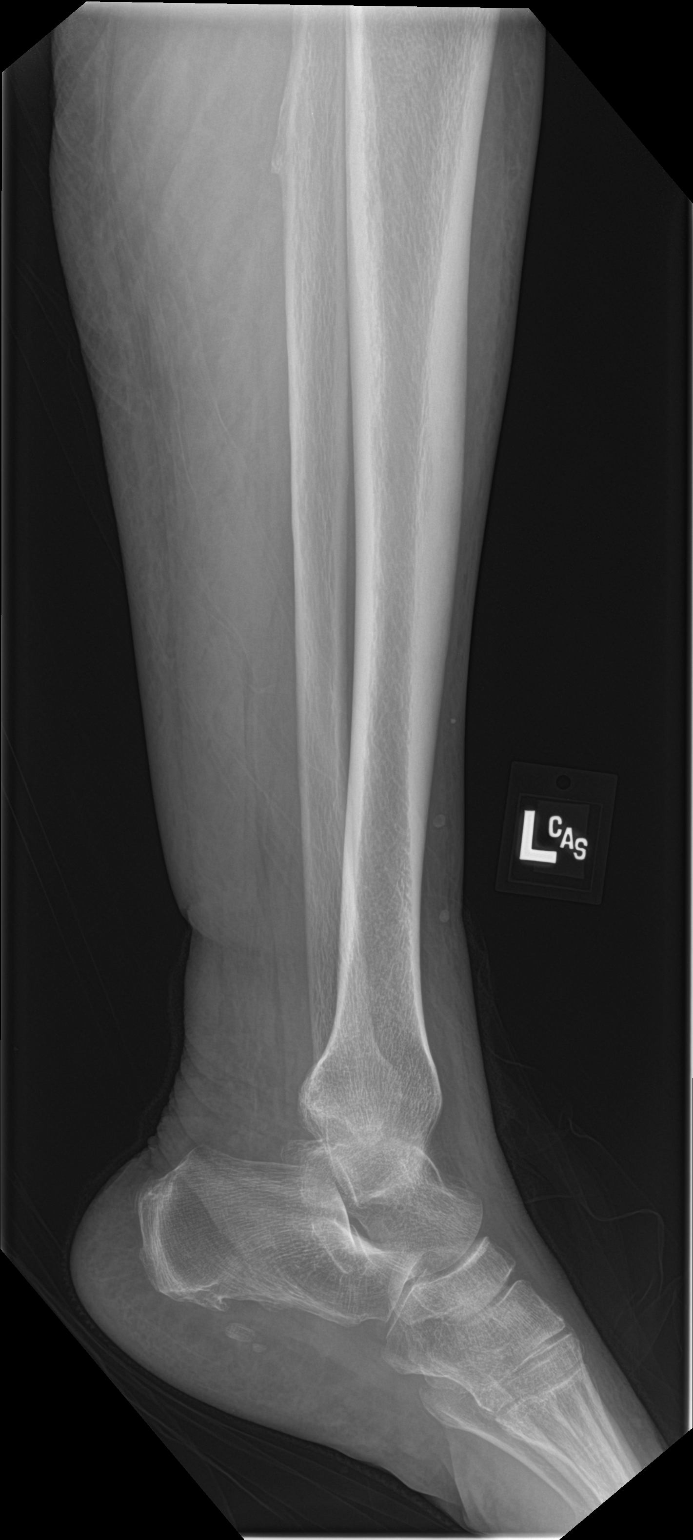
[im 4/4]
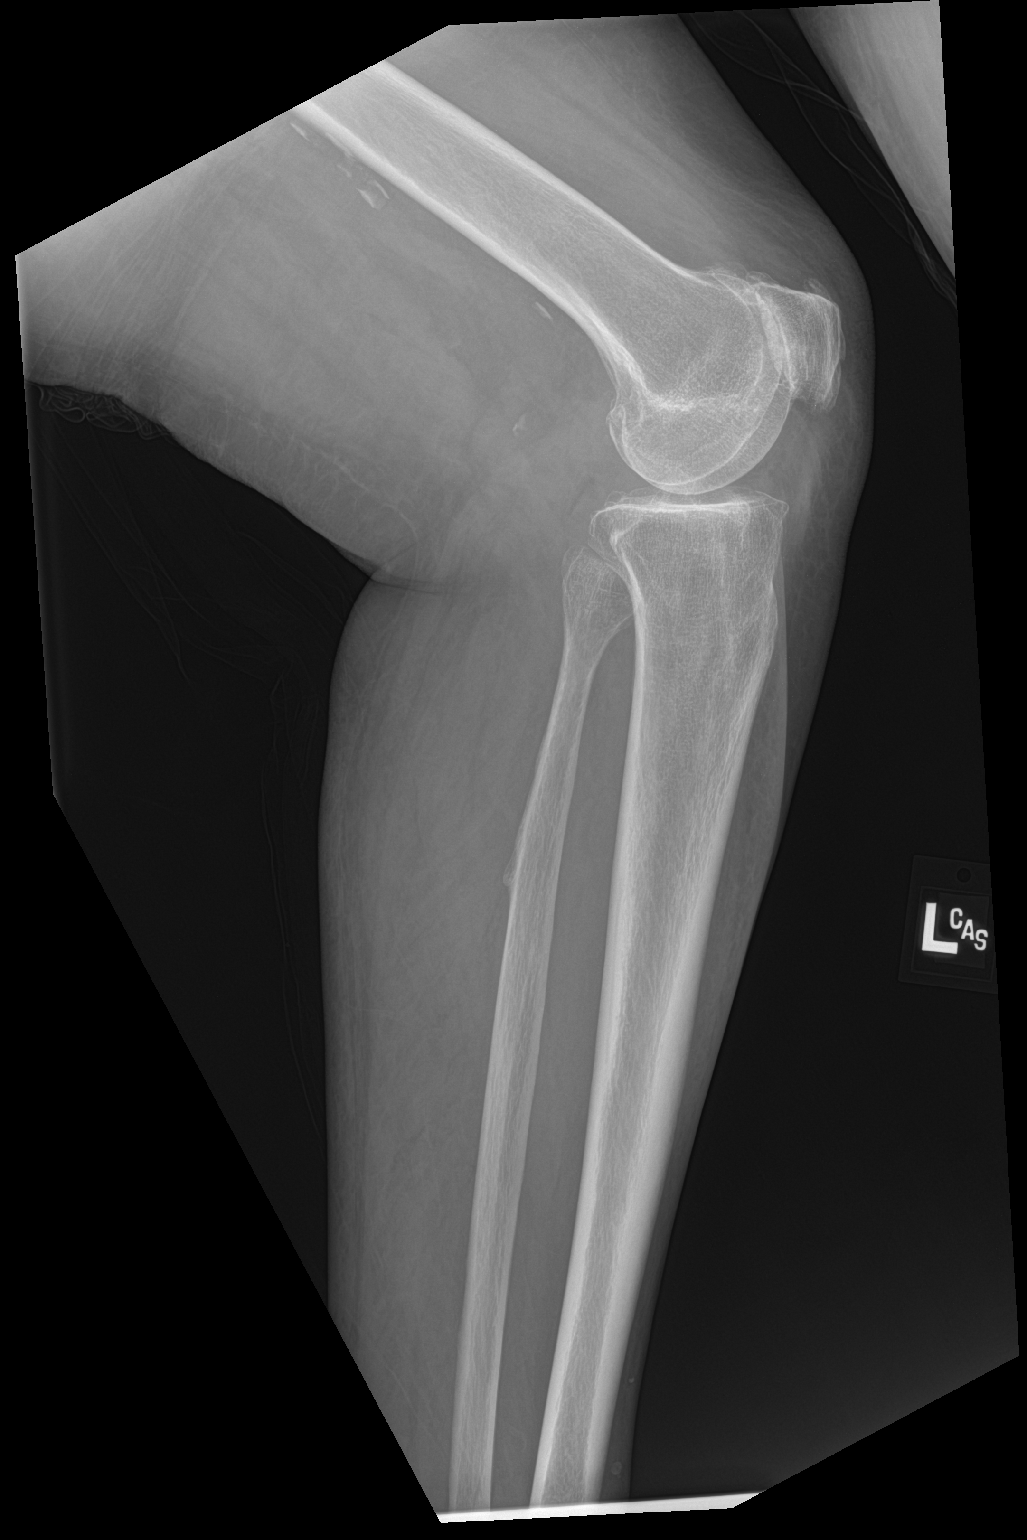

[4 of 4 positions shown; findings below may reference images not displayed]

FINDINGS: There is no evidence of acute fracture or dislocation. There is
tricompartment osteoarthritis of the left knee. There is
ossification along the proximal plantar fascia with plantar
calcaneal spurring. Phleboliths overlie the anterior lower leg soft
tissues. Vascular calcifications.
IMPRESSION: No acute osseous abnormality.

## 2023-09-16 IMAGING — US US EXTREM LOW VENOUS
1 series · 13 of 24 positions shown · non-contrast
Comparison: None.

CLINICAL DATA: Pain after sitting in vehicle

EXAM:
BILATERAL LOWER EXTREMITY VENOUS DOPPLER ULTRASOUND
TECHNIQUE: Gray-scale sonography with compression, as well as color and duplex
ultrasound, were performed to evaluate the deep venous system(s)
from the level of the common femoral vein through the popliteal and
proximal calf veins.

[Series 1: us venous img lower bilat (dvt) · portal-venous · 13 of 59 slices shown]
[im 1/59]
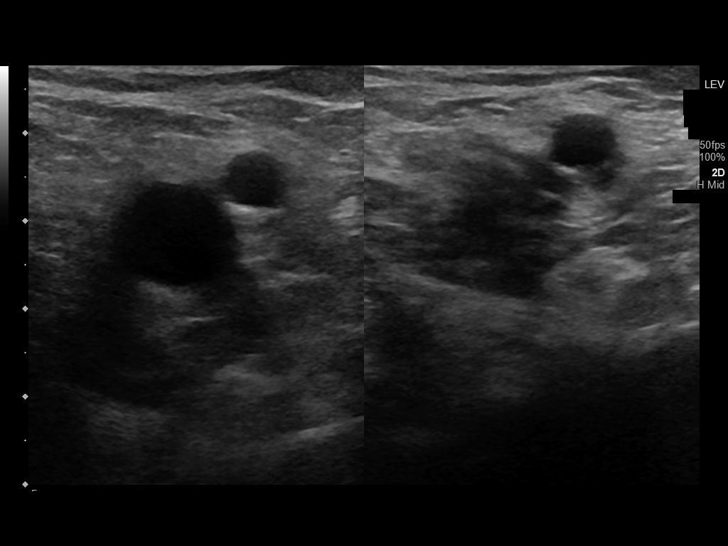
[im 6/59]
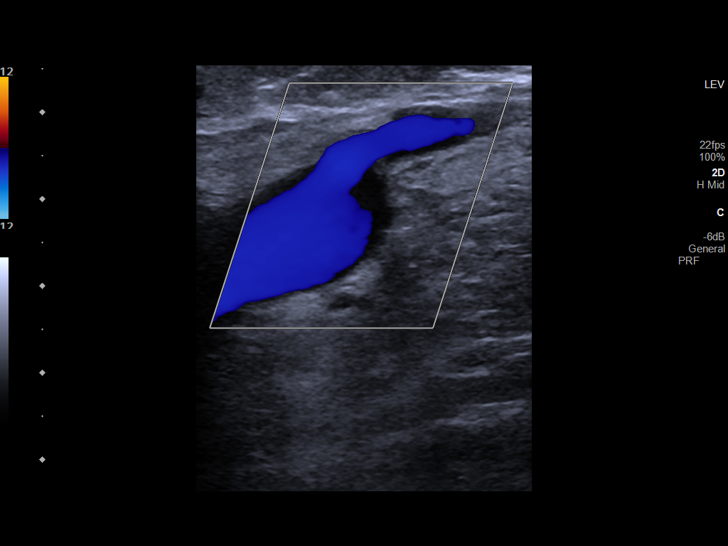
[im 11/59]
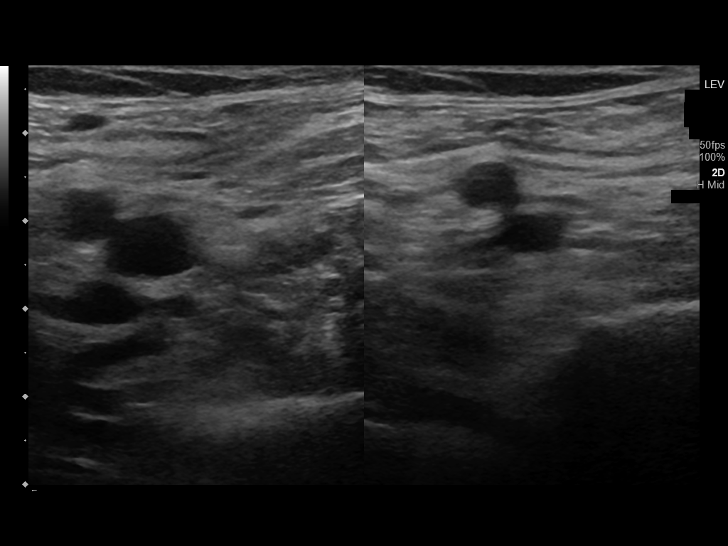
[im 16/59]
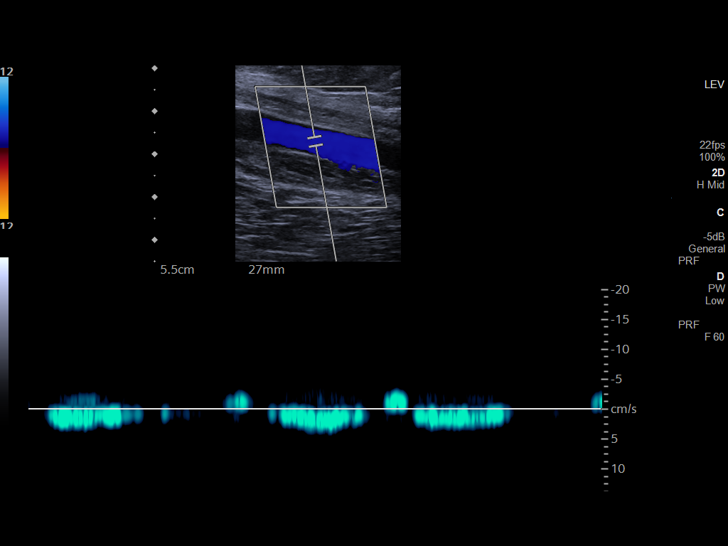
[im 21/59]
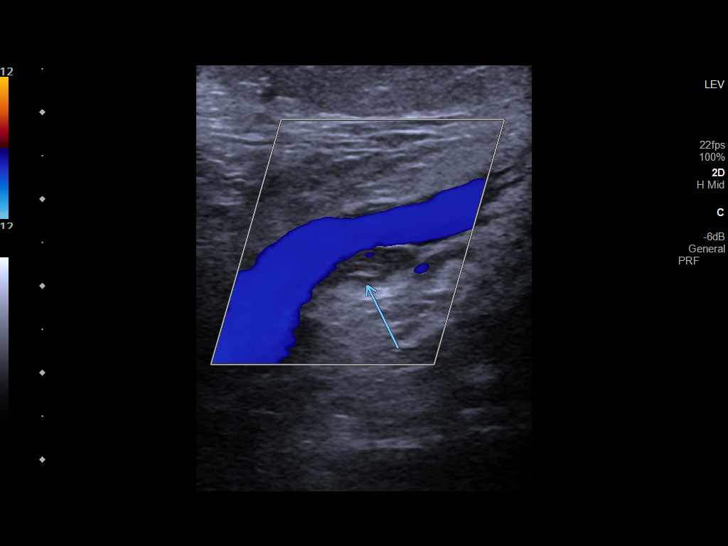
[im 26/59]
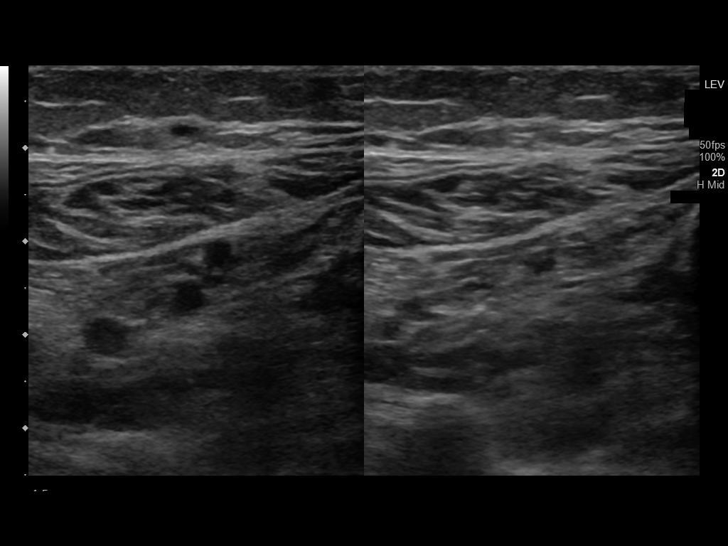
[im 31/59]
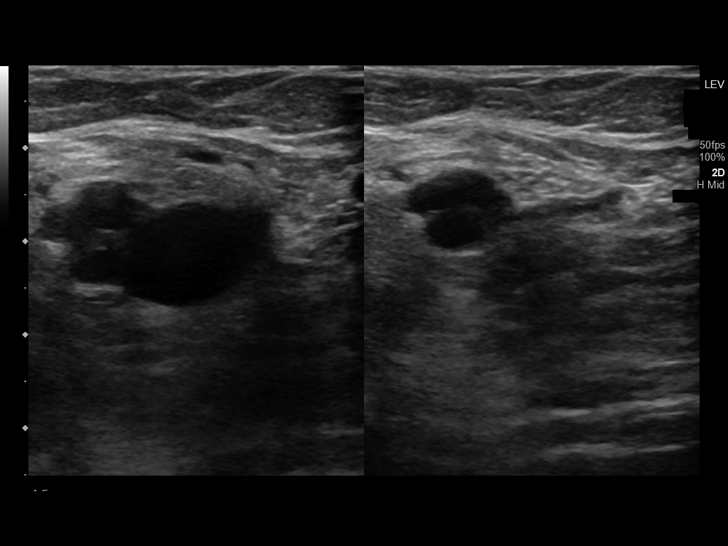
[im 33/59]
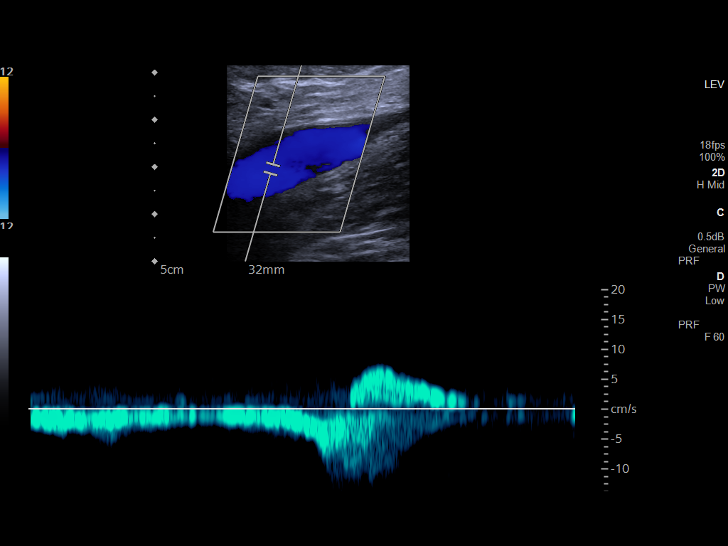
[im 38/59]
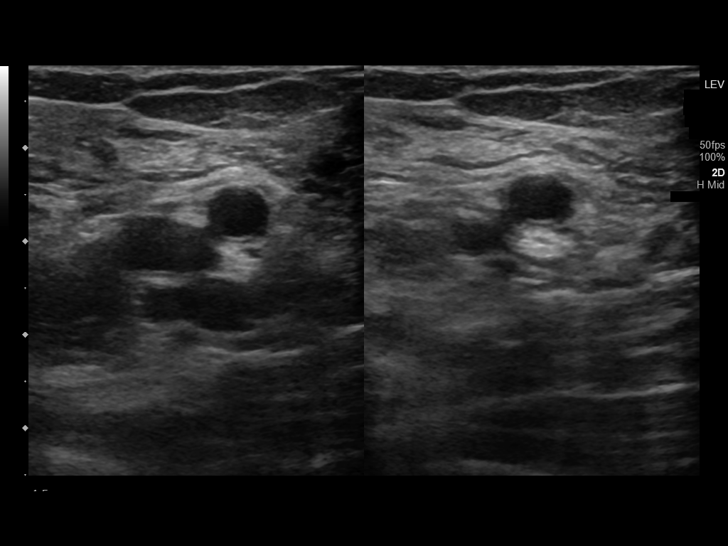
[im 43/59]
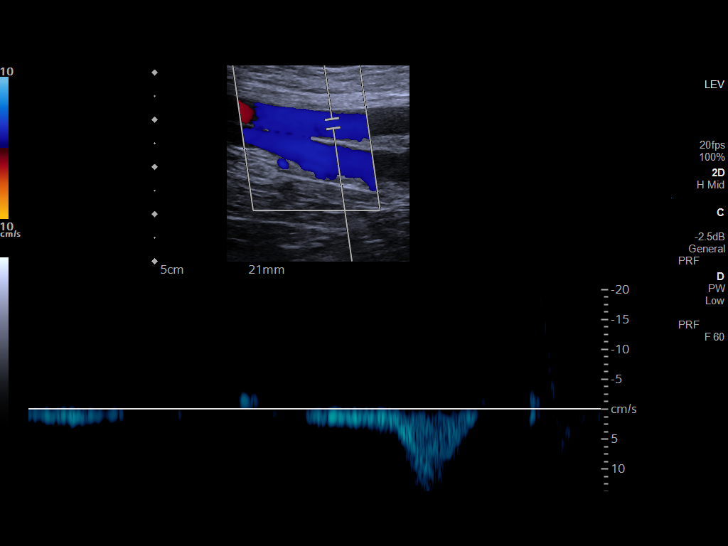
[im 48/59]
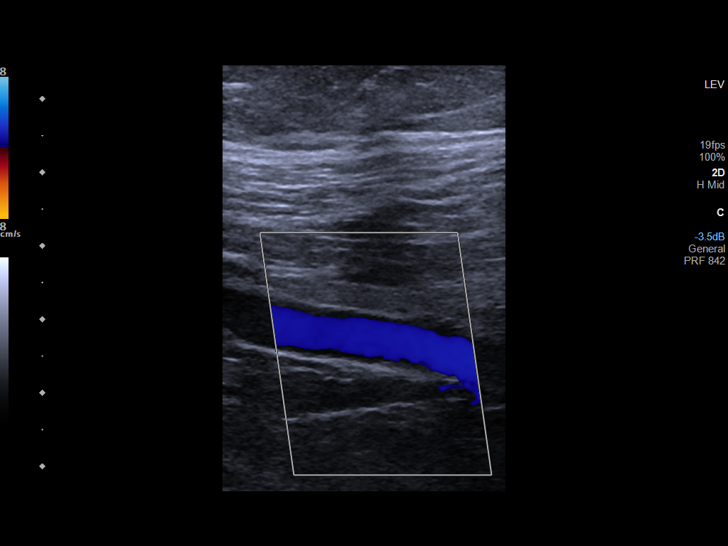
[im 53/59]
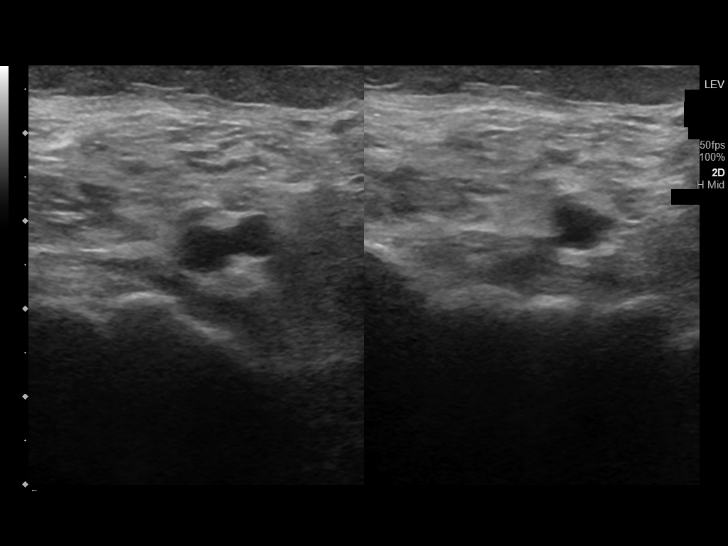
[im 59/59]
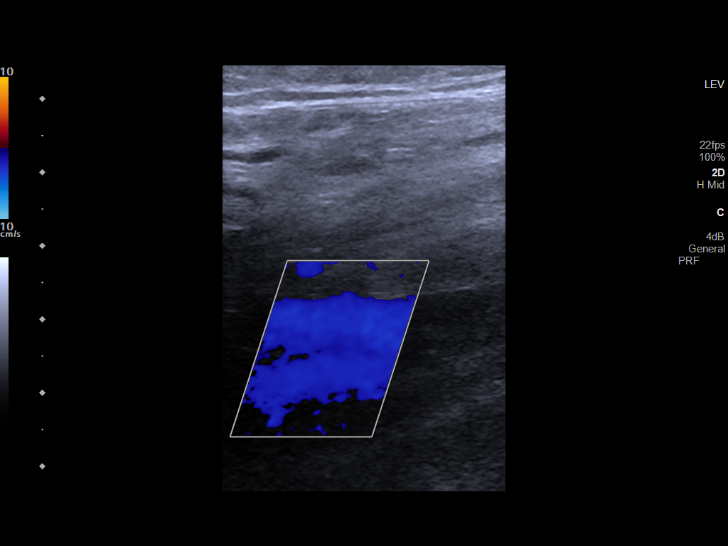

[13 of 24 positions shown; findings below may reference images not displayed]

FINDINGS: VENOUS

On the right, normal compressibility of the common femoral,
superficial femoral, and popliteal veins, as well as the visualized
calf veins. Visualized portions of profunda femoral vein and great
saphenous vein unremarkable. No filling defects to suggest DVT on
grayscale or color Doppler imaging. Doppler waveforms show normal
direction of venous flow, normal respiratory phasicity and response
to augmentation.

On the left, there is hypoechoic eccentric mural thrombus in the
popliteal vein proximally, resulting in incomplete compressibility.
There is good flow signal across this region on color Doppler. The
distal popliteal vein is normal. Normal compressibility of the
common femoral, superficial femoral, as well as the visualized calf
veins. Visualized portions of profunda femoral vein and great
saphenous vein unremarkable. No filling defects to suggest DVT on
grayscale or color Doppler imaging. Doppler waveforms show normal
direction of venous flow, normal respiratory phasicity and response
to augmentation.

OTHER

None.

Limitations: none
IMPRESSION: 1. Eccentric nonocclusive DVT in the proximal left popliteal vein,
of indeterminate chronicity.
2. Negative for right lower extremity DVT.
